# Patient Record
Sex: Male | Born: 1935 | Race: White | Hispanic: No | Marital: Single | Smoking: Former smoker
Health system: Southern US, Community
[De-identification: ages and names within clinical notes are randomized; demographics above are authoritative.]

## PROBLEM LIST (undated history)

## (undated) DIAGNOSIS — I10 Essential (primary) hypertension: Secondary | ICD-10-CM

## (undated) DIAGNOSIS — C801 Malignant (primary) neoplasm, unspecified: Secondary | ICD-10-CM

## (undated) DIAGNOSIS — I219 Acute myocardial infarction, unspecified: Secondary | ICD-10-CM

## (undated) DIAGNOSIS — I639 Cerebral infarction, unspecified: Secondary | ICD-10-CM

## (undated) DIAGNOSIS — K219 Gastro-esophageal reflux disease without esophagitis: Secondary | ICD-10-CM

## (undated) DIAGNOSIS — I2510 Atherosclerotic heart disease of native coronary artery without angina pectoris: Secondary | ICD-10-CM

## (undated) DIAGNOSIS — M1990 Unspecified osteoarthritis, unspecified site: Secondary | ICD-10-CM

## (undated) HISTORY — PX: HERNIA REPAIR: SHX51

## (undated) HISTORY — PX: CARDIAC CATHETERIZATION: SHX172

## (undated) HISTORY — PX: CORONARY ANGIOPLASTY: SHX604

---

## 2001-01-17 ENCOUNTER — Inpatient Hospital Stay (HOSPITAL_COMMUNITY): Admission: EM | Admit: 2001-01-17 | Discharge: 2001-01-21 | Payer: Self-pay

## 2002-07-19 ENCOUNTER — Encounter: Payer: Self-pay | Admitting: Specialist

## 2002-07-19 ENCOUNTER — Ambulatory Visit (HOSPITAL_COMMUNITY): Admission: RE | Admit: 2002-07-19 | Discharge: 2002-07-19 | Payer: Self-pay | Admitting: Specialist

## 2002-08-10 ENCOUNTER — Inpatient Hospital Stay (HOSPITAL_COMMUNITY): Admission: RE | Admit: 2002-08-10 | Discharge: 2002-08-11 | Payer: Self-pay | Admitting: Specialist

## 2002-08-10 ENCOUNTER — Encounter: Payer: Self-pay | Admitting: Specialist

## 2005-06-26 ENCOUNTER — Ambulatory Visit: Payer: Self-pay | Admitting: Ophthalmology

## 2005-07-01 ENCOUNTER — Ambulatory Visit: Payer: Self-pay | Admitting: Ophthalmology

## 2010-09-19 DIAGNOSIS — I10 Essential (primary) hypertension: Secondary | ICD-10-CM

## 2010-09-19 HISTORY — DX: Essential (primary) hypertension: I10

## 2011-05-15 ENCOUNTER — Inpatient Hospital Stay: Payer: Self-pay | Admitting: Internal Medicine

## 2011-05-16 DIAGNOSIS — G459 Transient cerebral ischemic attack, unspecified: Secondary | ICD-10-CM

## 2011-07-30 DIAGNOSIS — I2510 Atherosclerotic heart disease of native coronary artery without angina pectoris: Secondary | ICD-10-CM

## 2011-07-30 HISTORY — DX: Atherosclerotic heart disease of native coronary artery without angina pectoris: I25.10

## 2011-08-14 ENCOUNTER — Other Ambulatory Visit: Payer: Self-pay | Admitting: Otolaryngology

## 2011-08-27 ENCOUNTER — Encounter (HOSPITAL_COMMUNITY): Payer: Self-pay | Admitting: Pharmacy Technician

## 2011-09-03 NOTE — H&P (Signed)
Chief Complaint:  left knee pain.  Subjective: Patient is admitted for left total knee arthroplasty.  Patient is a 78 y.o. male who has been seen by Dr. Shelle Iron for ongoing hip pain.  They have been followed and found to have continued progressive pain.  They have been treated conservatively in the past including medications.  Despite conservative measures, they continue to have pain.  X-rays show that the patient has tri compartmental arthritis .  It is felt that they would benefit from undergoing surgical intervention.  Risks and benefits have been discussed with the patient and they elect to proceed with surgery.  The patient has no contraindications to the upcoming procedure such as ongoing infection or progressive neurological disease.  Allergies: Allergies  Allergen Reactions  . 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 male, well-nourished, well-developed, no acute distress. Alert, oriented, cooperative. Antalgic gait HENT:  Normocephalic, atraumatic. Pupils round and reactive. EOMs intact. NECK:  Supple, no bruits. CHEST:  Clear to anterior and posterior chest walls. No rhonchi, rales, wheezes. HEART:  Regular, rate and rhythm.  No murmurs.  S1 and S2 noted. ABDOMEN:  Soft, nontender, bowel sounds present. RECTAL/BREAST/GENITALIA:  Not done, not pertinent to present  illness. EXTREMITIES:  Mild effusion, TTP medial joint line, -5 to 110  Vitals: Pulse:69 Respirations:16 Blood Pressure:150/68  Assessment/Plan: End stage arthritis, left knee  The patient is being admitted to [facility] to undergo a left total knee arthroplasty.  Surgery will be performed by Dr. Jene Every.  Risks and benefits have been discussed with the patient and they elect to proceed wth the procedure.

## 2011-09-04 ENCOUNTER — Encounter (HOSPITAL_COMMUNITY)
Admission: RE | Admit: 2011-09-04 | Discharge: 2011-09-04 | Disposition: A | Discharge status: Home / Self Care | Payer: Medicare Other | Source: Ambulatory Visit | Attending: Specialist | Admitting: Specialist

## 2011-09-04 ENCOUNTER — Ambulatory Visit (HOSPITAL_COMMUNITY)
Admission: RE | Admit: 2011-09-04 | Discharge: 2011-09-04 | Disposition: A | Discharge status: Home / Self Care | Payer: Medicare Other | Source: Ambulatory Visit | Attending: Otolaryngology | Admitting: Otolaryngology

## 2011-09-04 ENCOUNTER — Encounter (HOSPITAL_COMMUNITY): Payer: Self-pay

## 2011-09-04 DIAGNOSIS — Z01812 Encounter for preprocedural laboratory examination: Secondary | ICD-10-CM | POA: Insufficient documentation

## 2011-09-04 DIAGNOSIS — Z01818 Encounter for other preprocedural examination: Secondary | ICD-10-CM | POA: Insufficient documentation

## 2011-09-04 DIAGNOSIS — I10 Essential (primary) hypertension: Secondary | ICD-10-CM | POA: Insufficient documentation

## 2011-09-04 HISTORY — DX: Essential (primary) hypertension: I10

## 2011-09-04 HISTORY — DX: Unspecified osteoarthritis, unspecified site: M19.90

## 2011-09-04 HISTORY — DX: Malignant (primary) neoplasm, unspecified: C80.1

## 2011-09-04 HISTORY — DX: Acute myocardial infarction, unspecified: I21.9

## 2011-09-04 HISTORY — DX: Cerebral infarction, unspecified: I63.9

## 2011-09-04 HISTORY — DX: Atherosclerotic heart disease of native coronary artery without angina pectoris: I25.10

## 2011-09-04 HISTORY — DX: Gastro-esophageal reflux disease without esophagitis: K21.9

## 2011-09-04 LAB — COMPREHENSIVE METABOLIC PANEL
ALT: 40 U/L (ref 0–53)
AST: 34 U/L (ref 0–37)
Albumin: 4.4 g/dL (ref 3.5–5.2)
Alkaline Phosphatase: 115 U/L (ref 39–117)
CO2: 27 mEq/L (ref 19–32)
Chloride: 99 mEq/L (ref 96–112)
GFR calc non Af Amer: 63 mL/min — ABNORMAL LOW (ref >90)
Potassium: 4.8 mEq/L (ref 3.5–5.1)
Sodium: 136 mEq/L (ref 135–145)
Total Bilirubin: 0.6 mg/dL (ref 0.3–1.2)

## 2011-09-04 LAB — URINALYSIS, ROUTINE W REFLEX MICROSCOPIC
Glucose, UA: NEGATIVE mg/dL
Hgb urine dipstick: NEGATIVE
Ketones, ur: NEGATIVE mg/dL
Leukocytes, UA: NEGATIVE
Protein, ur: NEGATIVE mg/dL
pH: 6.5 (ref 5.0–8.0)

## 2011-09-04 LAB — DIFFERENTIAL
Basophils Absolute: 0 10*3/uL (ref 0.0–0.1)
Basophils Relative: 0 % (ref 0–1)
Eosinophils Relative: 3 % (ref 0–5)
Monocytes Absolute: 0.7 10*3/uL (ref 0.1–1.0)
Monocytes Relative: 12 % (ref 3–12)

## 2011-09-04 LAB — APTT: aPTT: 32 seconds (ref 24–37)

## 2011-09-04 LAB — CBC
MCV: 87.4 fL (ref 78.0–100.0)
Platelets: 161 10*3/uL (ref 150–400)
RBC: 4.6 MIL/uL (ref 4.22–5.81)
WBC: 5.9 10*3/uL (ref 4.0–10.5)

## 2011-09-04 LAB — SURGICAL PCR SCREEN: MRSA, PCR: NEGATIVE

## 2011-09-04 NOTE — Patient Instructions (Signed)
22 [name] [name]  09/04/2011   Your procedure is scheduled on:  09/12/11 100pm-330 pm  Report to [facility] at 1100 AM.  Call this number if you have problems the morning of surgery: [phone]   Remember:   Do not eat food:After Midnight.  May have clear liquids:until 0700 am then npo .  Clear liquids include soda, tea, black coffee, apple or grape juice, broth.  Take these medicines the morning of surgery with A SIP OF WATER:    Do not wear jewelry,  Do not wear lotions, powders, or perfumes.   .  Do not bring valuables to the hospital.  Contacts, dentures or bridgework may not be worn into surgery.  Leave suitcase in the car. After surgery it may be brought to your room.  For patients admitted to the hospital, checkout time is 11:00 AM the day of discharge.     Special Instructions: CHG Shower Use Special Wash: 1/2 bottle night before surgery and 1/2 bottle morning of surgery. shower chin to toes with CHG.  Wash face and private parts with regular soap.     Please read over the following fact sheets that you were given: MRSA Information, Blood Transfusion Fact Sheet, Incentive Spirometry Fact Sheet, coughing and deep breathing exercises, leg exercises

## 2011-09-12 ENCOUNTER — Encounter (HOSPITAL_COMMUNITY): Payer: Self-pay | Admitting: Anesthesiology

## 2011-09-12 ENCOUNTER — Encounter (HOSPITAL_COMMUNITY): Payer: Self-pay | Admitting: *Deleted

## 2011-09-12 ENCOUNTER — Inpatient Hospital Stay (HOSPITAL_COMMUNITY): Payer: Medicare Other

## 2011-09-12 ENCOUNTER — Inpatient Hospital Stay (HOSPITAL_COMMUNITY): Payer: Medicare Other | Admitting: Anesthesiology

## 2011-09-12 ENCOUNTER — Encounter (HOSPITAL_COMMUNITY)
Admission: RE | Disposition: A | Discharge status: Home Health | Payer: Self-pay | Source: Ambulatory Visit | Attending: Specialist

## 2011-09-12 ENCOUNTER — Inpatient Hospital Stay (HOSPITAL_COMMUNITY)
Admission: RE | Admit: 2011-09-12 | Discharge: 2011-09-15 | DRG: 470 | Disposition: A | Discharge status: Home Health | Payer: Medicare Other | Source: Ambulatory Visit | Attending: Specialist | Admitting: Specialist

## 2011-09-12 DIAGNOSIS — I252 Old myocardial infarction: Secondary | ICD-10-CM

## 2011-09-12 DIAGNOSIS — Z8673 Personal history of transient ischemic attack (TIA), and cerebral infarction without residual deficits: Secondary | ICD-10-CM

## 2011-09-12 DIAGNOSIS — M1710 Unilateral primary osteoarthritis, unspecified knee: Principal | ICD-10-CM | POA: Diagnosis present

## 2011-09-12 DIAGNOSIS — R339 Retention of urine, unspecified: Secondary | ICD-10-CM | POA: Diagnosis not present

## 2011-09-12 DIAGNOSIS — I10 Essential (primary) hypertension: Secondary | ICD-10-CM | POA: Diagnosis present

## 2011-09-12 DIAGNOSIS — M1990 Unspecified osteoarthritis, unspecified site: Secondary | ICD-10-CM

## 2011-09-12 DIAGNOSIS — I2510 Atherosclerotic heart disease of native coronary artery without angina pectoris: Secondary | ICD-10-CM | POA: Diagnosis present

## 2011-09-12 HISTORY — PX: TOTAL KNEE ARTHROPLASTY: SHX125

## 2011-09-12 LAB — TYPE AND SCREEN
ABO/RH(D): A POS
Antibody Screen: NEGATIVE

## 2011-09-12 SURGERY — ARTHROPLASTY, KNEE, TOTAL
Anesthesia: General | Site: Knee | Laterality: Left | Wound class: Clean

## 2011-09-12 MED ORDER — RAMIPRIL 10 MG PO CAPS
20.0000 mg | ORAL_CAPSULE | Freq: Every day | ORAL | Status: DC
  Administered 2011-09-13 – 2011-09-15 (×3): 20 mg via ORAL
  Filled 2011-09-12 (×4): qty 2

## 2011-09-12 MED ORDER — PROPOFOL 10 MG/ML IV EMUL
INTRAVENOUS | Status: DC | PRN
  Administered 2011-09-12: 50 mg via INTRAVENOUS

## 2011-09-12 MED ORDER — ONDANSETRON HCL 4 MG PO TABS: 4.0000 mg | ORAL_TABLET | Freq: Four times a day (QID) | ORAL | Status: DC | PRN

## 2011-09-12 MED ORDER — ONDANSETRON HCL 4 MG/2ML IJ SOLN: 4.0000 mg | Freq: Four times a day (QID) | INTRAMUSCULAR | Status: DC | PRN

## 2011-09-12 MED ORDER — LIDOCAINE HCL (CARDIAC) 20 MG/ML IV SOLN
INTRAVENOUS | Status: DC | PRN
  Administered 2011-09-12: 80 mg via INTRAVENOUS

## 2011-09-12 MED ORDER — HYDROMORPHONE HCL PF 1 MG/ML IJ SOLN
INTRAMUSCULAR | Status: DC | PRN
  Administered 2011-09-12: 0.5 mg via INTRAVENOUS

## 2011-09-12 MED ORDER — ROCURONIUM BROMIDE 100 MG/10ML IV SOLN
INTRAVENOUS | Status: DC | PRN
  Administered 2011-09-12: 40 mg via INTRAVENOUS

## 2011-09-12 MED ORDER — MAGNESIUM 500 MG PO CAPS: 1.0000 | ORAL_CAPSULE | Freq: Every day | ORAL | Status: DC

## 2011-09-12 MED ORDER — AMLODIPINE BESYLATE 5 MG PO TABS
5.0000 mg | ORAL_TABLET | Freq: Every day | ORAL | Status: DC
  Administered 2011-09-13 – 2011-09-15 (×3): 5 mg via ORAL
  Filled 2011-09-12 (×4): qty 1

## 2011-09-12 MED ORDER — CHLORHEXIDINE GLUCONATE 4 % EX LIQD: 60.0000 mL | Freq: Once | CUTANEOUS | Status: DC

## 2011-09-12 MED ORDER — NEOSTIGMINE METHYLSULFATE 1 MG/ML IJ SOLN
INTRAMUSCULAR | Status: DC | PRN
  Administered 2011-09-12: 4 mg via INTRAVENOUS

## 2011-09-12 MED ORDER — DIPHENHYDRAMINE HCL 12.5 MG/5ML PO ELIX: 12.5000 mg | ORAL_SOLUTION | ORAL | Status: DC | PRN

## 2011-09-12 MED ORDER — RIVAROXABAN 10 MG PO TABS: 10.0000 mg | ORAL_TABLET | Freq: Every day | ORAL | Status: DC

## 2011-09-12 MED ORDER — METOCLOPRAMIDE HCL 10 MG PO TABS
5.0000 mg | ORAL_TABLET | Freq: Three times a day (TID) | ORAL | Status: DC | PRN
  Administered 2011-09-13: 10 mg via ORAL
  Filled 2011-09-12: qty 1

## 2011-09-12 MED ORDER — SODIUM CHLORIDE 0.9 % IJ SOLN: 9.0000 mL | INTRAMUSCULAR | Status: DC | PRN

## 2011-09-12 MED ORDER — ACETAMINOPHEN 325 MG PO TABS: 650.0000 mg | ORAL_TABLET | Freq: Four times a day (QID) | ORAL | Status: DC | PRN

## 2011-09-12 MED ORDER — FENTANYL CITRATE 0.05 MG/ML IJ SOLN
INTRAMUSCULAR | Status: DC | PRN
  Administered 2011-09-12 (×5): 50 ug via INTRAVENOUS

## 2011-09-12 MED ORDER — METOCLOPRAMIDE HCL 5 MG/ML IJ SOLN: 5.0000 mg | Freq: Three times a day (TID) | INTRAMUSCULAR | Status: DC | PRN

## 2011-09-12 MED ORDER — DIPHENHYDRAMINE HCL 50 MG/ML IJ SOLN: 12.5000 mg | Freq: Four times a day (QID) | INTRAMUSCULAR | Status: DC | PRN

## 2011-09-12 MED ORDER — SODIUM CHLORIDE 0.9 % IR SOLN
Status: DC | PRN
  Administered 2011-09-12: 13:00:00

## 2011-09-12 MED ORDER — MENTHOL 3 MG MT LOZG: 1.0000 | LOZENGE | OROMUCOSAL | Status: DC | PRN

## 2011-09-12 MED ORDER — PROMETHAZINE HCL 25 MG/ML IJ SOLN: 6.2500 mg | INTRAMUSCULAR | Status: DC | PRN

## 2011-09-12 MED ORDER — HYDROMORPHONE 0.3 MG/ML IV SOLN
INTRAVENOUS | Status: DC
  Administered 2011-09-12 (×2): 0.2 mg via INTRAVENOUS
  Administered 2011-09-12: 15:00:00 via INTRAVENOUS
  Administered 2011-09-13: 0.599 mg via INTRAVENOUS
  Administered 2011-09-13: 0 mg via INTRAVENOUS
  Administered 2011-09-13: 0.4 mg via INTRAVENOUS
  Administered 2011-09-13: 0.6 mg via INTRAVENOUS
  Administered 2011-09-13: 0.399 mg via INTRAVENOUS
  Administered 2011-09-13 – 2011-09-14 (×2): 0.599 mg via INTRAVENOUS
  Administered 2011-09-14: 0.199 mg via INTRAVENOUS
  Administered 2011-09-14: 0.2 mg via INTRAVENOUS
  Administered 2011-09-14: 0.6 mg via INTRAVENOUS
  Administered 2011-09-14: 0.4 mg via INTRAVENOUS
  Administered 2011-09-15: 0.2 mg via INTRAVENOUS
  Filled 2011-09-12: qty 25

## 2011-09-12 MED ORDER — ACETAMINOPHEN 650 MG RE SUPP: 650.0000 mg | Freq: Four times a day (QID) | RECTAL | Status: DC | PRN

## 2011-09-12 MED ORDER — OXYCODONE-ACETAMINOPHEN 5-325 MG PO TABS: 1.0000 | ORAL_TABLET | ORAL | Status: DC | PRN

## 2011-09-12 MED ORDER — KCL IN DEXTROSE-NACL 10-5-0.45 MEQ/L-%-% IV SOLN
INTRAVENOUS | Status: DC
  Administered 2011-09-12: 19:00:00 via INTRAVENOUS
  Filled 2011-09-12 (×4): qty 1000

## 2011-09-12 MED ORDER — METOPROLOL SUCCINATE ER 25 MG PO TB24
25.0000 mg | ORAL_TABLET | Freq: Every day | ORAL | Status: DC
  Administered 2011-09-13 – 2011-09-15 (×3): 25 mg via ORAL
  Filled 2011-09-12 (×4): qty 1

## 2011-09-12 MED ORDER — ENOXAPARIN SODIUM 30 MG/0.3ML ~~LOC~~ SOLN
30.0000 mg | SUBCUTANEOUS | Status: DC
  Administered 2011-09-13 – 2011-09-15 (×3): 30 mg via SUBCUTANEOUS
  Filled 2011-09-12 (×4): qty 0.3

## 2011-09-12 MED ORDER — GLYCOPYRROLATE 0.2 MG/ML IJ SOLN
INTRAMUSCULAR | Status: DC | PRN
  Administered 2011-09-12: .6 mg via INTRAVENOUS

## 2011-09-12 MED ORDER — ISOSORBIDE MONONITRATE ER 30 MG PO TB24
30.0000 mg | ORAL_TABLET | Freq: Every day | ORAL | Status: DC
  Administered 2011-09-13 – 2011-09-15 (×3): 30 mg via ORAL
  Filled 2011-09-12 (×4): qty 1

## 2011-09-12 MED ORDER — DEXAMETHASONE SODIUM PHOSPHATE 10 MG/ML IJ SOLN
INTRAMUSCULAR | Status: DC | PRN
  Administered 2011-09-12: 10 mg via INTRAVENOUS

## 2011-09-12 MED ORDER — SODIUM CHLORIDE 0.9 % IR SOLN
Status: DC | PRN
  Administered 2011-09-12: 3000 mL

## 2011-09-12 MED ORDER — ETOMIDATE 2 MG/ML IV SOLN
INTRAVENOUS | Status: DC | PRN
  Administered 2011-09-12: 6 mg via INTRAVENOUS

## 2011-09-12 MED ORDER — ACETAMINOPHEN 10 MG/ML IV SOLN
INTRAVENOUS | Status: DC | PRN
  Administered 2011-09-12: 1000 mg via INTRAVENOUS

## 2011-09-12 MED ORDER — LACTATED RINGERS IV SOLN
INTRAVENOUS | Status: DC
  Administered 2011-09-12: 12:00:00 via INTRAVENOUS

## 2011-09-12 MED ORDER — MAGNESIUM OXIDE 400 MG PO TABS
400.0000 mg | ORAL_TABLET | Freq: Every day | ORAL | Status: DC
  Administered 2011-09-12 – 2011-09-15 (×4): 400 mg via ORAL
  Filled 2011-09-12 (×5): qty 1

## 2011-09-12 MED ORDER — CEFAZOLIN SODIUM-DEXTROSE 2-3 GM-% IV SOLR
2.0000 g | INTRAVENOUS | Status: AC
  Administered 2011-09-12: 2 g via INTRAVENOUS

## 2011-09-12 MED ORDER — CEFAZOLIN SODIUM 1-5 GM-% IV SOLN
1.0000 g | Freq: Four times a day (QID) | INTRAVENOUS | Status: AC
  Administered 2011-09-12 – 2011-09-13 (×3): 1 g via INTRAVENOUS
  Filled 2011-09-12 (×4): qty 50

## 2011-09-12 MED ORDER — PHENOL 1.4 % MT LIQD: 1.0000 | OROMUCOSAL | Status: DC | PRN

## 2011-09-12 MED ORDER — ONDANSETRON HCL 4 MG/2ML IJ SOLN
INTRAMUSCULAR | Status: DC | PRN
  Administered 2011-09-12: 4 mg via INTRAVENOUS

## 2011-09-12 MED ORDER — NALOXONE HCL 0.4 MG/ML IJ SOLN: 0.4000 mg | INTRAMUSCULAR | Status: DC | PRN

## 2011-09-12 MED ORDER — DIPHENHYDRAMINE HCL 12.5 MG/5ML PO ELIX
12.5000 mg | ORAL_SOLUTION | Freq: Four times a day (QID) | ORAL | Status: DC | PRN
  Filled 2011-09-12: qty 5

## 2011-09-12 MED ORDER — METHOCARBAMOL 500 MG PO TABS
500.0000 mg | ORAL_TABLET | Freq: Four times a day (QID) | ORAL | Status: DC | PRN
  Administered 2011-09-14 – 2011-09-15 (×2): 500 mg via ORAL
  Filled 2011-09-12 (×2): qty 1

## 2011-09-12 MED ORDER — HYDROMORPHONE HCL PF 1 MG/ML IJ SOLN
0.2500 mg | INTRAMUSCULAR | Status: DC | PRN
  Administered 2011-09-12 (×2): 0.5 mg via INTRAVENOUS

## 2011-09-12 MED ORDER — METHOCARBAMOL 100 MG/ML IJ SOLN
500.0000 mg | Freq: Four times a day (QID) | INTRAMUSCULAR | Status: DC | PRN
  Administered 2011-09-12: 500 mg via INTRAVENOUS
  Filled 2011-09-12 (×2): qty 5

## 2011-09-12 SURGICAL SUPPLY — 59 items
BAG SPEC THK2 15X12 ZIP CLS (MISCELLANEOUS) ×1
BAG ZIPLOCK 12X15 (MISCELLANEOUS) ×2 IMPLANT
BANDAGE ELASTIC 4 VELCRO ST LF (GAUZE/BANDAGES/DRESSINGS) ×2 IMPLANT
BANDAGE ELASTIC 6 VELCRO ST LF (GAUZE/BANDAGES/DRESSINGS) ×2 IMPLANT
BANDAGE ESMARK 6X9 LF (GAUZE/BANDAGES/DRESSINGS) ×1 IMPLANT
BLADE SAG 18X100X1.27 (BLADE) ×2 IMPLANT
BLADE SAW SGTL 13.0X1.19X90.0M (BLADE) ×2 IMPLANT
BNDG CMPR 9X6 STRL LF SNTH (GAUZE/BANDAGES/DRESSINGS) ×1
BNDG ESMARK 6X9 LF (GAUZE/BANDAGES/DRESSINGS) ×2
CEMENT HV SMART SET (Cement) ×3 IMPLANT
CHLORAPREP W/TINT 26ML (MISCELLANEOUS) IMPLANT
CLOTH BEACON ORANGE TIMEOUT ST (SAFETY) ×2 IMPLANT
CUFF TOURN SGL QUICK 34 (TOURNIQUET CUFF) ×2
CUFF TRNQT CYL 34X4X40X1 (TOURNIQUET CUFF) ×1 IMPLANT
DECANTER SPIKE VIAL GLASS SM (MISCELLANEOUS) ×2 IMPLANT
DRAPE LG THREE QUARTER DISP (DRAPES) ×3 IMPLANT
DRAPE ORTHO SPLIT 77X108 STRL (DRAPES) ×4
DRAPE POUCH INSTRU U-SHP 10X18 (DRAPES) ×2 IMPLANT
DRAPE SURG ORHT 6 SPLT 77X108 (DRAPES) ×2 IMPLANT
DRAPE U-SHAPE 47X51 STRL (DRAPES) ×2 IMPLANT
DRSG ADAPTIC 3X8 NADH LF (GAUZE/BANDAGES/DRESSINGS) ×2 IMPLANT
DRSG PAD ABDOMINAL 8X10 ST (GAUZE/BANDAGES/DRESSINGS) ×2 IMPLANT
DURAPREP 26ML APPLICATOR (WOUND CARE) ×2 IMPLANT
ELECT REM PT RETURN 9FT ADLT (ELECTROSURGICAL) ×2
ELECTRODE REM PT RTRN 9FT ADLT (ELECTROSURGICAL) ×1 IMPLANT
EVACUATOR 1/8 PVC DRAIN (DRAIN) ×2 IMPLANT
FACESHIELD LNG OPTICON STERILE (SAFETY) ×10 IMPLANT
GAUZE SPONGE 4X4 12PLY STRL LF (GAUZE/BANDAGES/DRESSINGS) ×1 IMPLANT
GLOVE BIOGEL PI IND STRL 6.5 (GLOVE) ×1 IMPLANT
GLOVE BIOGEL PI IND STRL 8 (GLOVE) ×1 IMPLANT
GLOVE BIOGEL PI INDICATOR 6.5 (GLOVE) ×1
GLOVE BIOGEL PI INDICATOR 8 (GLOVE) ×1
GLOVE ECLIPSE 6.5 STRL STRAW (GLOVE) ×2 IMPLANT
GLOVE SURG SS PI 8.0 STRL IVOR (GLOVE) ×4 IMPLANT
GOWN PREVENTION PLUS XLARGE (GOWN DISPOSABLE) ×2 IMPLANT
HANDPIECE INTERPULSE COAX TIP (DISPOSABLE) ×2
IMMOBILIZER KNEE 20 (SOFTGOODS) ×2
IMMOBILIZER KNEE 20 THIGH 36 (SOFTGOODS) ×1 IMPLANT
KIT BASIN OR (CUSTOM PROCEDURE TRAY) ×2 IMPLANT
MANIFOLD NEPTUNE II (INSTRUMENTS) ×2 IMPLANT
NEEDLE HYPO 22GX1.5 SAFETY (NEEDLE) ×1 IMPLANT
NS IRRIG 1000ML POUR BTL (IV SOLUTION) ×1 IMPLANT
PACK TOTAL JOINT (CUSTOM PROCEDURE TRAY) ×2 IMPLANT
PADDING CAST COTTON 6X4 STRL (CAST SUPPLIES) ×2 IMPLANT
POSITIONER SURGICAL ARM (MISCELLANEOUS) ×2 IMPLANT
SET HNDPC FAN SPRY TIP SCT (DISPOSABLE) ×1 IMPLANT
SPONGE SURGIFOAM ABS GEL 100 (HEMOSTASIS) ×2 IMPLANT
STAPLER VISISTAT (STAPLE) ×2 IMPLANT
SUCTION FRAZIER 12FR DISP (SUCTIONS) ×2 IMPLANT
SUT BONE WAX W31G (SUTURE) ×2 IMPLANT
SUT VIC AB 1 CT1 27 (SUTURE) ×8
SUT VIC AB 1 CT1 27XBRD ANTBC (SUTURE) ×4 IMPLANT
SUT VIC AB 2-0 CT1 27 (SUTURE) ×6
SUT VIC AB 2-0 CT1 TAPERPNT 27 (SUTURE) ×3 IMPLANT
SYR 30ML LL (SYRINGE) ×2 IMPLANT
TOWER CARTRIDGE SMART MIX (DISPOSABLE) ×2 IMPLANT
TRAY FOLEY CATH 14FRSI W/METER (CATHETERS) ×2 IMPLANT
WATER STERILE IRR 1500ML POUR (IV SOLUTION) ×2 IMPLANT
WRAP KNEE MAXI GEL POST OP (GAUZE/BANDAGES/DRESSINGS) ×2 IMPLANT

## 2011-09-12 NOTE — Interval H&P Note (Signed)
History and Physical Interval Note:  09/12/2011 12:22 PM  [name] [name]  has presented today for surgery, with the diagnosis of Degenerative joint disease left knee    The various methods of treatment have been discussed with the patient and family. After consideration of risks, benefits and other options for treatment, the patient has consented to  Procedure(s): TOTAL KNEE ARTHROPLASTY as a surgical intervention .  The patients' history has been reviewed, patient examined, no change in status, stable for surgery.  I have reviewed the patients' chart and labs.  Questions were answered to the patient's satisfaction.     Kassey Laforest C

## 2011-09-12 NOTE — Preoperative (Signed)
Beta Blockers   Reason not to administer Beta Blockers:Metoprolol taken this am 09-12-11 at 0630.

## 2011-09-12 NOTE — Anesthesia Postprocedure Evaluation (Signed)
  Anesthesia Post-op Note  Patient: [name] [name]  Procedure(s) Performed:  TOTAL KNEE ARTHROPLASTY  Patient Location: PACU  Anesthesia Type: General  Level of Consciousness: awake and alert   Airway and Oxygen Therapy: Patient Spontanous Breathing  Post-op Pain: mild  Post-op Assessment: Post-op Vital signs reviewed, Patient's Cardiovascular Status Stable, Respiratory Function Stable, Patent Airway and No signs of Nausea or vomiting  Post-op Vital Signs: stable  Complications: No apparent anesthesia complications

## 2011-09-12 NOTE — Progress Notes (Signed)
ANTICOAGULATION CONSULT NOTE - Initial Consult  Pharmacy Consult for Lovenox Indication: VTE prophylaxis  Allergies  Allergen Reactions  . Celebrex (Celecoxib)   . Vicodin (Hydrocodone-Acetaminophen) Other (See Comments)    NIGHTMARES     Patient Measurements: TBW= 82.6 KG (09/04/11) Ht: 175 cm   Vital Signs: Temp: 98 F (36.7 C) (01/18 1502) BP: 147/78 mmHg (01/18 1502) Pulse Rate: 55  (01/18 1502)  Labs: (09/04/11) Scr=1.03 H/H=14/40.2 PLT=161 PT/INR=13.03/26/03  Medical History: Past Medical History  Diagnosis Date  . Hypertension   . Coronary artery disease   . Myocardial infarction     2002, stent implanted   . Stroke     TIA 05/2011   . GERD (gastroesophageal reflux disease)   . Arthritis     arthritis in knees   . Cancer     hx of basal cell carcinoma     Medications:  Prescriptions prior to admission  Medication Sig Dispense Refill  . amLODipine (NORVASC) 5 MG tablet Take 5 mg by mouth daily before breakfast.       . clopidogrel (PLAVIX) 75 MG tablet Take 75 mg by mouth daily.      . fish oil-omega-3 fatty acids 1000 MG capsule Take 2 g by mouth daily.       . isosorbide mononitrate (IMDUR) 30 MG 24 hr tablet Take 30 mg by mouth daily before breakfast.       . Magnesium 500 MG CAPS Take 1 capsule by mouth daily.       . metoprolol succinate (TOPROL-XL) 25 MG 24 hr tablet Take 25 mg by mouth daily before breakfast.       . Multiple Vitamin (MULITIVITAMIN WITH MINERALS) TABS Take 1 tablet by mouth daily.       . ramipril (ALTACE) 10 MG capsule Take 20 mg by mouth daily before breakfast.       . rosuvastatin (CRESTOR) 40 MG tablet Take 40 mg by mouth at bedtime.       Marland Kitchen aspirin EC 81 MG tablet Take 81 mg by mouth daily before breakfast.       . naproxen sodium (ANAPROX) 220 MG tablet Take 440 mg by mouth 2 (two) times daily as needed. PAIN         Assessment: 78 yo male s/p Left TKA and Lovenox to be started for DVT prophylaxis. Will give Lovenox 30mg   Q24h due to concomittant ASA and Plavix.   Plan:  1. Lovenox 30mg  SQ Q24h to be started tomorrow at 10am 2. Will f/u Scr and CBC.  Dorethea Clan 09/12/2011,3:16 PM

## 2011-09-12 NOTE — Transfer of Care (Signed)
Immediate Anesthesia Transfer of Care Note  Patient: [name] [name]  Procedure(s) Performed:  TOTAL KNEE ARTHROPLASTY  Patient Location: PACU  Anesthesia Type: General  Level of Consciousness: awake, alert  and oriented  Airway & Oxygen Therapy: Patient Spontanous Breathing and Patient connected to face mask oxygen  Post-op Assessment: Report given to PACU RN, Post -op Vital signs reviewed and stable and Patient moving all extremities  Post vital signs: Reviewed and stable Filed Vitals:   09/12/11 1031  BP: 142/75  Pulse: 59  Temp: 36.7 C  Resp: 20    Complications: No apparent anesthesia complications

## 2011-09-12 NOTE — Brief Op Note (Signed)
09/12/2011  2:41 PM  PATIENT:  [name] [name]  78 y.o. male  PRE-OPERATIVE DIAGNOSIS:  Degenerative joint disease left knee    POST-OPERATIVE DIAGNOSIS:  Degenerative joint disease left knee    PROCEDURE:  Procedure(s): TOTAL KNEE ARTHROPLASTY  SURGEON:  Surgeon(s): Javier Docker, MD  PHYSICIAN ASSISTANT:   ASSISTANTS: Strader   ANESTHESIA:   spinal  EBL:  Total I/O In: 1000 [I.V.:1000] Out: 325 [Urine:275; Blood:50]  BLOOD ADMINISTERED:none  DRAINS: hemovac  LOCAL MEDICATIONS USED:  MARCAINE 20CC  SPECIMEN:  No Specimen  DISPOSITION OF SPECIMEN:  N/A  COUNTS:  YES  TOURNIQUET:   Total Tourniquet Time Documented: Thigh (Left) - 86 minutes  DICTATION: .Other Dictation: Dictation Number [id number]  PLAN OF CARE: Admit to inpatient   PATIENT DISPOSITION:  PACU - hemodynamically stable.   Delay start of Pharmacological VTE agent (>24hrs) due to surgical blood loss or risk of bleeding:  {YES/NO/NOT APPLICABLE:20182

## 2011-09-12 NOTE — Anesthesia Preprocedure Evaluation (Addendum)
Anesthesia Evaluation  Patient identified by MRN, date of birth, ID band Patient awake    Reviewed: Allergy & Precautions, H&P , NPO status , Patient's Chart, lab work & pertinent test results  Airway Mallampati: II TM Distance: >3 FB Neck ROM: Full    Dental No notable dental hx.    Pulmonary neg pulmonary ROS,  clear to auscultation  Pulmonary exam normal       Cardiovascular hypertension, + CAD and + Past MI Regular Normal    Neuro/Psych TIANegative Psych ROS   GI/Hepatic Neg liver ROS, GERD-  Medicated,  Endo/Other  Negative Endocrine ROS  Renal/GU negative Renal ROS  Genitourinary negative   Musculoskeletal negative musculoskeletal ROS (+)   Abdominal   Peds negative pediatric ROS (+)  Hematology negative hematology ROS (+)   Anesthesia Other Findings   Reproductive/Obstetrics negative OB ROS                          Anesthesia Physical Anesthesia Plan  ASA: III  Anesthesia Plan: General   Post-op Pain Management:    Induction: Intravenous  Airway Management Planned: Oral ETT and LMA  Additional Equipment:   Intra-op Plan:   Post-operative Plan: Extubation in OR  Informed Consent: I have reviewed the patients History and Physical, chart, labs and discussed the procedure including the risks, benefits and alternatives for the proposed anesthesia with the patient or authorized representative who has indicated his/her understanding and acceptance.   Dental advisory given  Plan Discussed with: CRNA  Anesthesia Plan Comments:         Anesthesia Quick Evaluation

## 2011-09-12 NOTE — Plan of Care (Signed)
Problem: Consults Goal: Diagnosis- Total Joint Replacement Outcome: Progressing Primary Total Knee     

## 2011-09-13 LAB — BASIC METABOLIC PANEL
CO2: 21 mEq/L (ref 19–32)
Calcium: 8.6 mg/dL (ref 8.4–10.5)
Creatinine, Ser: 0.74 mg/dL (ref 0.50–1.35)
GFR calc non Af Amer: 80 mL/min — ABNORMAL LOW (ref >90)
Glucose, Bld: 200 mg/dL — ABNORMAL HIGH (ref 70–99)
Sodium: 130 mEq/L — ABNORMAL LOW (ref 135–145)

## 2011-09-13 LAB — CBC
MCH: 30.7 pg (ref 26.0–34.0)
MCHC: 35.4 g/dL (ref 30.0–36.0)
MCV: 86.8 fL (ref 78.0–100.0)
Platelets: 139 10*3/uL — ABNORMAL LOW (ref 150–400)

## 2011-09-13 MED ORDER — ASPIRIN EC 81 MG PO TBEC
81.0000 mg | DELAYED_RELEASE_TABLET | Freq: Every day | ORAL | Status: DC
  Administered 2011-09-14 – 2011-09-15 (×2): 81 mg via ORAL
  Filled 2011-09-13 (×3): qty 1

## 2011-09-13 MED ORDER — CALCIUM CARBONATE ANTACID 500 MG PO CHEW
1.0000 | CHEWABLE_TABLET | Freq: Three times a day (TID) | ORAL | Status: DC | PRN
  Administered 2011-09-13: 200 mg via ORAL
  Filled 2011-09-13 (×3): qty 1

## 2011-09-13 MED ORDER — OXYCODONE HCL 5 MG PO TABS
10.0000 mg | ORAL_TABLET | ORAL | Status: DC | PRN
  Administered 2011-09-13 – 2011-09-15 (×2): 10 mg via ORAL
  Filled 2011-09-13 (×2): qty 2

## 2011-09-13 MED ORDER — CLOPIDOGREL BISULFATE 75 MG PO TABS
75.0000 mg | ORAL_TABLET | Freq: Every day | ORAL | Status: DC
  Administered 2011-09-13 – 2011-09-15 (×3): 75 mg via ORAL
  Filled 2011-09-13 (×4): qty 1

## 2011-09-13 NOTE — Progress Notes (Signed)
CM spoke with pt concerning d/c planning. Pt lives in [place]. Pt given choice list for [facility], per MD order for HHPT. Pt states no DME needed. Adult daughter to provide RW, crutches. Pt states having 5 adult children who will assist with home care.  Leonie Green [phone]

## 2011-09-13 NOTE — Progress Notes (Signed)
Subjective: 1 Day Post-Op Procedure(s) (LRB): TOTAL KNEE ARTHROPLASTY (Left) Patient reports pain as 6 on 0-10 scale.   Denies CP or SOB.  Positive flatus. Objective: Vital signs in last 24 hours: Temp:  [97.7 F (36.5 C)-98.3 F (36.8 C)] 98.3 F (36.8 C) (01/19 0451) Pulse Rate:  [52-62] 57  (01/19 0451) Resp:  [9-20] 20  (01/19 0800) BP: (110-148)/(53-78) 148/71 mmHg (01/19 0451) SpO2:  [90 %-100 %] 97 % (01/19 0451) Weight:  [82.555 kg (182 lb)] 82.555 kg (182 lb) (01/18 1800)  Intake/Output from previous day: 01/18 0701 - 01/19 0700 In: 3720 [P.O.:360; I.V.:3050; IV Piggyback:100] Out: 2715 [Urine:2625; Drains:40; Blood:50] Intake/Output this shift:     Basename 09/13/11 0450  HGB 12.8*    Basename 09/13/11 0450  WBC 12.3*  RBC 4.17*  HCT 36.2*  PLT 139*    Basename 09/13/11 0450  NA 130*  K 4.6  CL 97  CO2 21  BUN 13  CREATININE 0.74  GLUCOSE 200*  CALCIUM 8.6   No results found for this basename: LABPT:2,INR:2 in the last 72 hours  Neurologically intact Neurovascular intact Sensation intact distally Dorsiflexion/Plantar flexion intact Incision: dressing C/D/I Compartment soft  Assessment/Plan: 1 Day Post-Op Procedure(s) (LRB): TOTAL KNEE ARTHROPLASTY (Left) Advance diet Up with therapy Continue foley due to urinary output monitoring Will increase percocet and wean from PCA today Resume Plavix today along with Lovenox per pharm Dressing change in am KVO IV  Kamile Fassler R. 09/13/2011, 9:04 AM

## 2011-09-13 NOTE — Progress Notes (Signed)
Physical Therapy Treatment Patient Details Name: [name] [name] MRN: [id number] DOB: [birth year] Today's Date: 09/13/2011 5284-1324 1TA,1GT PT Assessment/Plan  PT - Assessment/Plan Comments on Treatment Session: Patient tolerating ambulation...reports decreased SOB as compared to am session.  Unable to wean from PCA...reports oral meds not helping pain.  Daughter arrived at end of session. PT Plan: Discharge plan remains appropriate PT Frequency: 7X/week Follow Up Recommendations: Home health PT;Supervision/Assistance - 24 hour Equipment Recommended: Rolling walker with 5" wheels PT Goals  Acute Rehab PT Goals PT Goal Formulation: With patient Time For Goal Achievement: 5 days Pt will go Supine/Side to Sit: with modified independence PT Goal: Supine/Side to Sit - Progress: Progressing toward goal Pt will go Sit to Supine/Side: with modified independence PT Goal: Sit to Supine/Side - Progress: Progressing toward goal Pt will go Sit to Stand: with supervision PT Goal: Sit to Stand - Progress: Progressing toward goal Pt will go Stand to Sit: with supervision PT Goal: Stand to Sit - Progress: Progressing toward goal Pt will Ambulate: >150 feet;with supervision;with rolling walker PT Goal: Ambulate - Progress: Progressing toward goal Pt will Perform Home Exercise Program: with supervision, verbal cues required/provided PT Goal: Perform Home Exercise Program - Progress: Progressing toward goal  PT Treatment Precautions/Restrictions  Precautions Precautions: Knee Required Braces or Orthoses: Yes Knee Immobilizer: On when out of bed or walking;Discontinue once straight leg raise with < 10 degree lag Restrictions Weight Bearing Restrictions: Yes LLE Weight Bearing: Weight bearing as tolerated Mobility (including Balance) Bed Mobility Bed Mobility: Yes Supine to Sit: 4: Min assist Supine to Sit Details (indicate cue type and reason): for left LE Transfers Transfers: Yes Sit to  Stand: 4: Min assist;With upper extremity assist;From bed Sit to Stand Details (indicate cue type and reason): cues for safety (likes to pull up on walker) Stand to Sit: 4: Min assist;With upper extremity assist;To bed Stand to Sit Details: cues for left leg out Ambulation/Gait Ambulation/Gait: Yes Ambulation/Gait Assistance: 4: Min assist Ambulation/Gait Assistance Details (indicate cue type and reason): minguard assist cues initially to remember sequence (reports left leg in no more pain than right) Ambulation Distance (Feet): 200 Feet Assistive device: Rolling walker Gait Pattern: Step-to pattern    Exercise  Total Joint Exercises Knee Flexion: AROM;Left;5 reps;Seated End of Session PT - End of Session Equipment Utilized During Treatment: Gait belt;Left knee immobilizer Activity Tolerance: Patient tolerated treatment well Patient left: in bed;with call bell in reach;with family/visitor present Nurse Communication: Mobility status for transfers;Mobility status for ambulation General Behavior During Session: [facility] for tasks performed Cognition: [facility] for tasks performed  [employer] 09/13/2011, 3:59 PM

## 2011-09-13 NOTE — Progress Notes (Signed)
Orthopedic Tech Progress Note Patient Details:  [name] [name] Sep 14, 1920 [id number]       CPM Left Knee CPM Left Knee: Off Left Knee Flexion (Degrees): 60  Left Knee Extension (Degrees): 10              Patient ID: [name] [name], male   DOB: [birth year], 78 y.o.   MRN: [id number]   [name] [name] 09/13/2011, 6:43 PM

## 2011-09-13 NOTE — Progress Notes (Signed)
Physical Therapy Evaluation Patient Details Name: [name] [name] MRN: [id number] DOB: 1921/06/18 Today's Date: 09/13/2011 8413-2440 Ev2  Problem List: There is no problem list on file for this patient.   Past Medical History:  Past Medical History  Diagnosis Date  . Hypertension   . Coronary artery disease   . Stroke     TIA 05/2011   . GERD (gastroesophageal reflux disease)   . Arthritis     arthritis in knees   . Cancer     hx of basal cell carcinoma   . Myocardial infarction     2002, stent implanted    Past Surgical History:  Past Surgical History  Procedure Date  . Cardiac catheterization   . Coronary angioplasty     stent 2002   . Hernia repair     bilateral     PT Assessment/Plan/Recommendation PT Assessment Clinical Impression Statement: Patient presents s/p left TKA with decreased independence in mobility and will benefit from skilled PT to maximize independence and allow d/c home with family assist and HHPT. PT Recommendation/Assessment: Patient will need skilled PT in the acute care venue PT Problem List: Decreased strength;Decreased range of motion;Decreased activity tolerance;Decreased mobility;Decreased balance;Pain;Decreased knowledge of use of DME;Decreased knowledge of precautions PT Therapy Diagnosis : Difficulty walking;Acute pain PT Plan PT Frequency: 7X/week PT Treatment/Interventions: DME instruction;Gait training;Stair training;Functional mobility training;Therapeutic activities;Therapeutic exercise;Patient/family education PT Recommendation Follow Up Recommendations: Home health PT Equipment Recommended: Rolling walker with 5" wheels PT Goals  Acute Rehab PT Goals PT Goal Formulation: With patient Time For Goal Achievement: 5 days Pt will go Supine/Side to Sit: with modified independence PT Goal: Supine/Side to Sit - Progress: Goal set today Pt will go Sit to Supine/Side: with modified independence PT Goal: Sit to Supine/Side - Progress:  Goal set today Pt will go Sit to Stand: with supervision PT Goal: Sit to Stand - Progress: Goal set today Pt will go Stand to Sit: with supervision PT Goal: Stand to Sit - Progress: Goal set today Pt will Ambulate: >150 feet;with supervision;with rolling walker PT Goal: Ambulate - Progress: Goal set today Pt will Go Up / Down Stairs: 3-5 stairs;with rail(s);with min assist PT Goal: Up/Down Stairs - Progress: Goal set today Pt will Perform Home Exercise Program: with supervision, verbal cues required/provided PT Goal: Perform Home Exercise Program - Progress: Goal set today  PT Evaluation Precautions/Restrictions  Precautions Precautions: Knee Restrictions Weight Bearing Restrictions: Yes LLE Weight Bearing: Weight bearing as tolerated Prior Functioning  Home Living Lives With: Alone Receives Help From: Family (daughters wil lbe assisting at discharge) Type of Home: House Home Layout: One level Home Access: Stairs to enter Entrance Stairs-Rails: Left;Right;Can reach both Entrance Stairs-Number of Steps: 4 Prior Function Level of Independence: Independent with basic ADLs;Independent with transfers;Independent with gait;Independent with homemaking with ambulation Driving: Yes Vocation: Full time employment Cognition Cognition Arousal/Alertness: Awake/alert Overall Cognitive Status: Appears within functional limits for tasks assessed Orientation Level: Oriented X4 Sensation/Coordination   Extremity Assessment RLE Assessment RLE Assessment: Within Functional Limits LLE Assessment LLE Assessment: Exceptions to WFL LLE AROM (degrees) LLE Overall AROM Comments: AAROM grossly left knee extension -20*, flexion 45* LLE Strength LLE Overall Strength Comments: able to lift antigravity independently with significant quad lag Mobility (including Balance) Bed Mobility Bed Mobility: Yes Supine to Sit: 4: Min assist Supine to Sit Details (indicate cue type and reason): for left  LE Sitting - Scoot to Edge of Bed: 5: Supervision Transfers Transfers: Yes Sit to Stand: 4: Min assist;With  upper extremity assist;From bed Sit to Stand Details (indicate cue type and reason): cues for technique and minguard assist Stand to Sit: 5: Supervision;To chair/3-in-1;With upper extremity assist;With armrests Stand to Sit Details: cues for hand placement and left leg out to sit Ambulation/Gait Ambulation/Gait: Yes Ambulation/Gait Assistance: 4: Min assist Ambulation/Gait Assistance Details (indicate cue type and reason): minguard with cues for sequence, posture, technique Ambulation Distance (Feet): 100 Feet Assistive device: Rolling walker Gait Pattern: Trunk flexed;Antalgic    Exercise  Total Joint Exercises Ankle Circles/Pumps: AROM;Both;20 reps;Supine Quad Sets: AROM;Left;10 reps;Supine Heel Slides: AAROM;Left;10 reps;Supine Hip ABduction/ADduction: AAROM;Left;10 reps;Supine End of Session PT - End of Session Equipment Utilized During Treatment: Gait belt;Left knee immobilizer Activity Tolerance: Patient tolerated treatment well Patient left: in chair;with call bell in reach Nurse Communication: Mobility status for transfers;Mobility status for ambulation General Behavior During Session: [facility] for tasks performed Cognition: [place] for tasks performed  [facility] 09/13/2011, 12:08 PM

## 2011-09-14 LAB — BASIC METABOLIC PANEL
BUN: 15 mg/dL (ref 6–23)
CO2: 26 mEq/L (ref 19–32)
GFR calc non Af Amer: 73 mL/min — ABNORMAL LOW (ref >90)
Glucose, Bld: 157 mg/dL — ABNORMAL HIGH (ref 70–99)
Potassium: 4.3 mEq/L (ref 3.5–5.1)
Sodium: 133 mEq/L — ABNORMAL LOW (ref 135–145)

## 2011-09-14 LAB — CBC
HCT: 32.4 % — ABNORMAL LOW (ref 39.0–52.0)
Hemoglobin: 11.5 g/dL — ABNORMAL LOW (ref 13.0–17.0)
MCH: 30.9 pg (ref 26.0–34.0)
MCHC: 35.5 g/dL (ref 30.0–36.0)
RBC: 3.72 MIL/uL — ABNORMAL LOW (ref 4.22–5.81)

## 2011-09-14 NOTE — Progress Notes (Signed)
Patient ID: [name] [name], male   DOB: 06-29-1921, 78 y.o.   MRN: [id number] Post op day 2. Afebrile.  Hgb 11.8.  Wound redressed--incision clean and dry.

## 2011-09-14 NOTE — Progress Notes (Signed)
Physical Therapy Treatment Patient Details Name: [name] [name] MRN: [id number] DOB: 06/30/1921 Today's Date: 09/14/2011 1415-1450 1GT,1TE  PT Assessment/Plan  PT - Assessment/Plan Comments on Treatment Session: Patient still on PCA for pain control.  Has learned not to overdo since he was so sore last night from yesterdays sessions.  Will need HHPT and 24* assist at d/c. PT Plan: Discharge plan remains appropriate PT Frequency: 7X/week Follow Up Recommendations: Home health PT;Supervision/Assistance - 24 hour Equipment Recommended: Rolling walker with 5" wheels PT Goals  Acute Rehab PT Goals Pt will go Supine/Side to Sit: with modified independence PT Goal: Supine/Side to Sit - Progress: Progressing toward goal Pt will go Sit to Supine/Side: with modified independence PT Goal: Sit to Supine/Side - Progress: Progressing toward goal Pt will go Sit to Stand: with supervision PT Goal: Sit to Stand - Progress: Progressing toward goal Pt will go Stand to Sit: with supervision PT Goal: Stand to Sit - Progress: Progressing toward goal Pt will Ambulate: with supervision;>150 feet;with rolling walker PT Goal: Ambulate - Progress: Not progressing (due to cautious after increased pain yesterday) Pt will Perform Home Exercise Program: with supervision, verbal cues required/provided PT Goal: Perform Home Exercise Program - Progress: Progressing toward goal  PT Treatment Precautions/Restrictions  Precautions Precautions: Knee Required Braces or Orthoses: Yes Knee Immobilizer: On except when in CPM;On when out of bed or walking;Discontinue once straight leg raise with < 10 degree lag Restrictions Weight Bearing Restrictions: Yes LLE Weight Bearing: Weight bearing as tolerated Mobility (including Balance) Bed Mobility Bed Mobility: Yes Supine to Sit: 4: Min assist;HOB flat Supine to Sit Details (indicate cue type and reason): for left LE Sitting - Scoot to Edge of Bed: 6: Modified  independent (Device/Increase time) Sitting - Scoot to Edge of Bed Details (indicate cue type and reason): min assist to hold left LE Sit to Supine: 4: Min assist;HOB flat Sit to Supine - Details (indicate cue type and reason): for left LE, cues for positioning in bed Transfers Sit to Stand: 4: Min assist;With upper extremity assist;From chair/3-in-1;From bed;With armrests Sit to Stand Details (indicate cue type and reason): cues for hand placement Stand to Sit: With upper extremity assist;To bed;4: Min assist Stand to Sit Details: steadying assist Ambulation/Gait Ambulation/Gait Assistance: 4: Min assist Ambulation/Gait Assistance Details (indicate cue type and reason): steadying assist for balance Ambulation Distance (Feet): 90 Feet Assistive device: Rolling walker Gait Pattern: Decreased stance time - left;Decreased step length - right;Step-to pattern;Antalgic    Exercise  Total Joint Exercises Ankle Circles/Pumps: AROM;Both;20 reps;Supine Quad Sets: AROM;Left;10 reps;Supine Short Arc Quad: AAROM;Left;10 reps;Supine Heel Slides: AAROM;Left;10 reps;Supine Hip ABduction/ADduction: AROM;Left;10 reps;Supine Straight Leg Raises: AAROM;Left;10 reps;Supine Issued handout with HEP. End of Session PT - End of Session Equipment Utilized During Treatment: Gait belt;Left knee immobilizer Activity Tolerance: Patient tolerated treatment well Patient left: in bed;with call bell in reach General Behavior During Session: [facility] for tasks performed Cognition: [facility] for tasks performed  [facility] 09/14/2011, 3:19 PM

## 2011-09-14 NOTE — Progress Notes (Signed)
CM spoke with pt concerning choice for [facility] services. Per pt choice [facility] to provide HHPT. CM notified [facility] of new pt referral  [name] [name] [phone]

## 2011-09-14 NOTE — Progress Notes (Signed)
In and out cath done. of clear amber urine removed from bladder. Pt tolerated procedure. Lonya Johannesen, rn.

## 2011-09-14 NOTE — Progress Notes (Signed)
PT Cancellation Note  Treatment cancelled today due to patient's refusal to participate.  Reports just worked with OT and had lots of pain last night.  Agreeable to try later today.  Ice applied and leg elevated on another pillow.   Will reattempt in pm.  WYNN,CYNDI 09/14/2011, 10:01 AM

## 2011-09-14 NOTE — Progress Notes (Signed)
Occupational Therapy Evaluation Patient Details Name: [name] [name] MRN: [id number] DOB: 08-19-1921 Today's Date: 09/14/2011  Problem List: There is no problem list on file for this patient.   Past Medical History:  Past Medical History  Diagnosis Date  . Hypertension   . Coronary artery disease   . Stroke     TIA 05/2011   . GERD (gastroesophageal reflux disease)   . Arthritis     arthritis in knees   . Cancer     hx of basal cell carcinoma   . Myocardial infarction     2002, stent implanted    Past Surgical History:  Past Surgical History  Procedure Date  . Cardiac catheterization   . Coronary angioplasty     stent 2002   . Hernia repair     bilateral     OT Assessment/Plan/Recommendation OT Assessment Clinical Impression Statement: Pt is an 78 yo s/p L TKA. WBAT. Ambulating with KI. Pt presents with decreased strength and endurance and knowledge of AE and DME. Pt will benefit from OT in acute care setting to max indep and safety with ADL and functional mobility for ADL to facilitate safe D/C home and reach below established goals. Pt plans to go home with help from family. Pt will most likely not need HHOT. Pt needs a BSC for D/C. OT Recommendation/Assessment: Patient will need skilled OT in the acute care venue OT Problem List: Decreased strength;Decreased range of motion;Decreased activity tolerance;Decreased safety awareness;Decreased knowledge of use of DME or AE;Pain Barriers to Discharge: None OT Therapy Diagnosis : Generalized weakness OT Plan OT Frequency: Min 2X/week OT Treatment/Interventions: Self-care/ADL training;Therapeutic exercise;Energy conservation;DME and/or AE instruction;Therapeutic activities;Patient/family education OT Recommendation Follow Up Recommendations: No OT follow up Equipment Recommended: 3 in 1 bedside comode Individuals Consulted Consulted and Agree with Results and Recommendations: Patient OT Goals Acute Rehab OT Goals OT  Goal Formulation: With patient Time For Goal Achievement: 2 weeks ADL Goals Pt Will Perform Lower Body Bathing: with set-up;Sit to stand from chair;with adaptive equipment ADL Goal: Lower Body Bathing - Progress: Goal set today Pt Will Transfer to Toilet: with modified independence;3-in-1;with DME ADL Goal: Toilet Transfer - Progress: Goal set today Pt Will Perform Tub/Shower Transfer: with supervision;with DME;Ambulation;Shower transfer;Other (comment) (3n1) ADL Goal: Tub/Shower Transfer - Progress: Goal set today  OT Evaluation Precautions/Restrictions  Precautions Precautions: Knee Required Braces or Orthoses: Yes Knee Immobilizer: On when out of bed or walking;Discontinue once straight leg raise with < 10 degree lag Restrictions Weight Bearing Restrictions: Yes LLE Weight Bearing: Weight bearing as tolerated Prior Functioning Home Living Lives With: Alone Receives Help From: Family Type of Home: Mobile home Home Layout: One level Home Access: Stairs to enter Entrance Stairs-Rails: Left;Right;Can reach both Entrance Stairs-Number of Steps: 4 Bathroom Shower/Tub: Tub/shower unit;Walk-in shower Bathroom Toilet: Standard Bathroom Accessibility: Yes How Accessible: Accessible via walker Home Adaptive Equipment: None Prior Function Level of Independence: Independent with basic ADLs;Independent with transfers;Independent with gait;Independent with homemaking with ambulation Able to Take Stairs?: Yes Driving: Yes Vocation: Unemployed ADL ADL Eating/Feeding: Independent Where Assessed - Eating/Feeding: Edge of bed Grooming: Set up;Simulated Where Assessed - Grooming: Sitting, chair Upper Body Bathing: Simulated;Set up Where Assessed - Upper Body Bathing: Sitting, chair Lower Body Bathing: Minimal assistance;Simulated Where Assessed - Lower Body Bathing: Sit to stand from chair Upper Body Dressing: Simulated;Set up Where Assessed - Upper Body Dressing: Sitting, chair Lower  Body Dressing: Minimal assistance;Simulated Where Assessed - Lower Body Dressing: Sit to stand from chair Toilet  Transfer: Performed;Minimal Dentist Method: Proofreader: Set designer - Clothing Manipulation: Independent Where Assessed - Glass blower/designer Manipulation: Standing Toileting - Hygiene: Independent Where Assessed - Toileting Hygiene: Sit to stand from 3-in-1 or toilet;Standing Tub/Shower Transfer: Not assessed (educated pt on shwer transfer technique) Tub/Shower Transfer Method: Ambulating (back into shower) Psychologist, educational: Walk in shower [facility]) Equipment Used: Rolling walker Ambulation Related to ADLs: Min A. Pt mostly limited by pain. ADL Comments: Pt will have assistance from family after D/C to help with ADL as needed. Vision/Perception  Vision - History Baseline Vision: No visual deficits Perception Perception: Within Functional Limits Praxis Praxis: Intact Cognition Cognition Arousal/Alertness: Awake/alert Overall Cognitive Status: Appears within functional limits for tasks assessed Orientation Level: Oriented X4 Sensation/Coordination Sensation Light Touch: Appears Intact Proprioception: Appears Intact Coordination Gross Motor Movements are Fluid and Coordinated: Yes Fine Motor Movements are Fluid and Coordinated: Yes Extremity Assessment RUE Assessment RUE Assessment: Within Functional Limits LUE Assessment LUE Assessment: Within Functional Limits Mobility  Bed Mobility Bed Mobility: Yes Supine to Sit: 5: Supervision Sitting - Scoot to Edge of Bed: 6: Modified independent (Device/Increase time) Transfers Transfers: Yes Sit to Stand: 4: Min assist;With upper extremity assist;From bed Stand to Sit: 4: Min assist;With upper extremity assist;To bed   End of Session OT - End of Session Equipment Utilized During Treatment: Gait belt;Left knee immobilizer Activity Tolerance: Patient  limited by pain Patient left: in chair;with call bell in reach Nurse Communication: Mobility status for transfers General Behavior During Session: [facility] for tasks performed Cognition: [facility] for tasks performed   [facility] 09/14/2011, 12:49 PM  [facility], OTR/L  [phone] 09/14/2011

## 2011-09-14 NOTE — Progress Notes (Signed)
Foley cath [facility] Pt noted to not void but 50 ml. Bladder scan done. Pt noted to be retaining greater than of urine. In and out cath per protocol order implemented.

## 2011-09-15 LAB — CBC
HCT: 29.6 % — ABNORMAL LOW (ref 39.0–52.0)
Hemoglobin: 10.7 g/dL — ABNORMAL LOW (ref 13.0–17.0)
MCH: 31.5 pg (ref 26.0–34.0)
MCHC: 36.1 g/dL — ABNORMAL HIGH (ref 30.0–36.0)
MCV: 87.1 fL (ref 78.0–100.0)
RBC: 3.4 MIL/uL — ABNORMAL LOW (ref 4.22–5.81)

## 2011-09-15 LAB — BASIC METABOLIC PANEL
BUN: 14 mg/dL (ref 6–23)
CO2: 26 mEq/L (ref 19–32)
Calcium: 8.6 mg/dL (ref 8.4–10.5)
Creatinine, Ser: 0.91 mg/dL (ref 0.50–1.35)
GFR calc non Af Amer: 73 mL/min — ABNORMAL LOW (ref >90)
Glucose, Bld: 157 mg/dL — ABNORMAL HIGH (ref 70–99)

## 2011-09-15 MED ORDER — OXYCODONE HCL 5 MG PO TABS
15.0000 mg | ORAL_TABLET | ORAL | Status: DC | PRN
  Administered 2011-09-15 (×2): 15 mg via ORAL
  Filled 2011-09-15 (×2): qty 3

## 2011-09-15 MED ORDER — OXYCODONE HCL 10 MG PO TABS: 10.0000 mg | ORAL_TABLET | ORAL | Status: AC | PRN

## 2011-09-15 MED ORDER — ENOXAPARIN SODIUM 30 MG/0.3ML ~~LOC~~ SOLN: 30.0000 mg | SUBCUTANEOUS | Status: DC

## 2011-09-15 NOTE — Progress Notes (Signed)
Physical Therapy Treatment Patient Details Name: BREVYN RING MRN: [id number] DOB: 1921-01-20 Today's Date: 09/15/2011  [id number] TE  PT Assessment/Plan  PT - Assessment/Plan Comments on Treatment Session: Pt states that his pain is getting under control with new pain medication, however pt states that he did not want to ambulate until the afternoon.  Tolerated exercises well.  Pt to D/C this afternoon per pt if pain is well controlled.  PT to ambulate/stair train pt in pm.   PT Plan: Discharge plan remains appropriate PT Frequency: 7X/week Follow Up Recommendations: Home health PT;Supervision/Assistance - 24 hour Equipment Recommended: Rolling walker with 5" wheels PT Goals  Acute Rehab PT Goals PT Goal Formulation: With patient Time For Goal Achievement: 5 days Pt will Perform Home Exercise Program: with supervision, verbal cues required/provided PT Goal: Perform Home Exercise Program - Progress: Progressing toward goal  PT Treatment Precautions/Restrictions  Precautions Precautions: Knee Required Braces or Orthoses: Yes Knee Immobilizer: On except when in CPM;On when out of bed or walking;Discontinue once straight leg raise with < 10 degree lag Restrictions Weight Bearing Restrictions: Yes LLE Weight Bearing: Weight bearing as tolerated Mobility (including Balance) Bed Mobility Bed Mobility: No    Exercise  Total Joint Exercises Ankle Circles/Pumps: AROM;Both;20 reps;Supine Quad Sets: AROM;Left;10 reps;Supine Short Arc Quad: AAROM;Left;10 reps;Supine Heel Slides: AAROM;Left;10 reps;Supine Hip ABduction/ADduction: AROM;Left;10 reps;Supine Straight Leg Raises: AAROM;Left;10 reps;Supine End of Session PT - End of Session Activity Tolerance: Patient tolerated treatment well Patient left: in bed;with call bell in reach General Behavior During Session: [facility] for tasks performed Cognition: [facility] for tasks performed  Page, Meribeth Mattes 09/15/2011, 10:17 AM

## 2011-09-15 NOTE — Progress Notes (Signed)
Subjective: 3 Days Post-Op Procedure(s) (LRB): TOTAL KNEE ARTHROPLASTY (Left) Patient reports pain as 5 on 0-10 scale.   Denies CP or SOB.  Voiding without difficulty. Positive flatus.  There were some issues with pain control over the weekend but pt feels better today. Objective: Vital signs in last 24 hours: Temp:  [98.1 F (36.7 C)-98.3 F (36.8 C)] 98.1 F (36.7 C) (01/21 0507) Pulse Rate:  [76-89] 78  (01/21 0507) Resp:  [16-18] 16  (01/21 0507) BP: (114-132)/(56-63) 121/56 mmHg (01/21 0507) SpO2:  [90 %-98 %] 94 % (01/21 0507)  Intake/Output from previous day: 01/20 0701 - 01/21 0700 In: 694.8 [P.O.:360; I.V.:334.8] Out: 1075 [Urine:1075] Intake/Output this shift:     Basename 09/15/11 0454 09/14/11 0608 09/13/11 0450  HGB 10.7* 11.5* 12.8*    Basename 09/15/11 0454 09/14/11 0608  WBC 8.3 11.8*  RBC 3.40* 3.72*  HCT 29.6* 32.4*  PLT 108* 130*    Basename 09/15/11 0454 09/14/11 0608  NA 129* 133*  K 3.9 4.3  CL 93* 97  CO2 26 26  BUN 14 15  CREATININE 0.91 0.91  GLUCOSE 157* 157*  CALCIUM 8.6 9.0   No results found for this basename: LABPT:2,INR:2 in the last 72 hours  Neurologically intact Neurovascular intact Sensation intact distally Incision: no drainage and mild erythema over patella, mild edema Compartment soft  Assessment/Plan: 3 Days Post-Op Procedure(s) (LRB): TOTAL KNEE ARTHROPLASTY (Left) Up with therapy Discharge home with home health later today if pain well controlled Discussed strict elevation with the patient Lovenox for DVT prevention  Shea Swalley R. 09/15/2011, 9:12 AM

## 2011-09-15 NOTE — Op Note (Signed)
NAME:  SEANPATRICK, MAISANO NO.:  [id number]  MEDICAL RECORD NO.:  [id number]  LOCATION:                               FACILITY:  [facility]  PHYSICIAN:  Jene Every, M.D.    DATE OF BIRTH:  [birth year]  DATE OF PROCEDURE: DATE OF DISCHARGE:                              OPERATIVE REPORT   PREOPERATIVE DIAGNOSIS:  Degenerative joint disease of the left knee.  POSTOPERATIVE DIAGNOSIS:  Degenerative joint disease of the left knee.  PROCEDURE PERFORMED:  Left total knee arthroplasty.  ANESTHESIA:  General.  ASSISTANT:  Roma Schanz, P.A.  COMPONENTS:  DePuy rotating platform, 5 femur, 4 tibia, 10 mm insert, 38 patella.  HISTORY:  An 78 year old with end-stage osteoarthrosis of the knee, disabling, indicated for placement of degenerated joint bone-on-bone arthrosis.  Risks and benefits were discussed including bleeding, infection, damage to neurovascular structures, no change in symptoms, worsening symptoms, need for repeat manipulation, DVT, PE, anesthetic complications, etc.  TECHNIQUE:  With the patient in supine position, after induction of adequate general anesthesia, 2 g Kefzol, left lower extremity was prepped and draped and exsanguinated in usual sterile fashion.  Thigh tourniquet inflated to 300 mmHg.  Knee flexed.  Midline incision was made.  Full thickness flaps developed.  Median parapatellar arthrotomy was performed.  Patella was everted, the knee was flexed.  Soft tissues elevated medially.  Remnants of menisci and ACL were removed. Tricompartmental osteoarthrosis was noted.  Step drill was utilized to enter the femoral canal, 5 degree left utilized, pinned, 12 off the distal femur, __________ flexion contracture.  Oscillating saw performed a distal cut.  We then sized off the anterior cortex of the femur to a 5, pinned 3 degrees of external rotation and anterior posterior chamfer cuts were performed. Soft tissue was protected with Crego at all  times.  Attention was turned towards the tibia, subluxed.  External alignment guide utilized 10 off the high side which was lateral, parallel to the tibia, bisecting the ankle.  Pinned, proximal tibial cut performed.  We then used a flexion- extension blocks with protecting the soft tissues with __________. Flexion-extension blocks were equivalent at 10.  We then completed the tibia, sized to a 4 just the medial aspect of the tibial tubercle, maximizing the coverage, pinned, central drill hole, and punch guide utilized.  Completed the femur with a box cut with the jig bisecting the canal and the condyles.  Box cut was performed.  We then placed a trial femur and tibia, 10 mm insert, full extension, full flexion, good stability, varus valgus stressing 0-30 degrees.  Patella was everted, it measured to a 22.  We used a patellar planer marked to a 14. Oscillating saw was performed.  We drilled 3 peg holes, medializing the patella.  We placed a patellar component trial, reduced in good patellofemoral tracking.  All instrumentation was removed.  Used pulsatile lavage for irrigation.  Checked posteriorly, removed any remnants of the menisci.  We cauterized the geniculate, tibia flexed, __________ used.  Surfaces dried and mixed cement on the back table in appropriate fashion, injected into the proximal tibia.  Digitally pressurized the canal and impacted the 4 tibia, cemented the femur,  redundant cement removed.  This was impacted, placed a trial insert, reduced, and an axial load applied throughout the course of the case. Patella was cemented as well.  After appropriate curing of the cement, we removed the trials.  We inspected the joint, redundant cement removed.  Just prior to that, we had full extension, full flexion, and good stability.  After removing redundant cement and copiously irrigating the wound, we placed a 10 mm permanent insert, reduced it.  I had full extension, full flexion,  good stability, varus valgus stressing 0-30 degrees.  Good patellofemoral tracking.  Therefore, placed Hemovac, brought out through a lateral stab wound in the skin.  Copiously irrigated the wound, closed the patellar arthrotomy with #1 Vicryl interrupted figure-of-eight sutures, subcutaneous with 0 and 2-0 Vicryl simple sutures.  Skin was reapproximated with staples.  She had flexion to gravity at 90 degrees.  Wound was sterilely dressed.  Tourniquet was deflated and there was adequate revascularization appreciated. The patient tolerated the procedure well.  No complications.  Tourniquet time was 86 minutes.     Jene Every, M.D.     Cordelia Pen  D:  09/12/2011  T:  09/13/2011  Job:  [id number]

## 2011-09-15 NOTE — Progress Notes (Signed)
Pt had been advised by a staff member (he thought it was the doctor or case manager) that a walker would be delivered to his room prior to discharge.  I contacted [facility] [phone] and spoke with Selena Batten.  She stated no order or prescription had been received for a walker.    I contacted Dr. Ermelinda Das office and spoke with Marchelle Folks.  She paged the doctor with a request to fax a prescription directly to [facility] so a walker can be delivered to their home this afternoon.  Kim with [facility] called me back to confirm the above and stated she would follow up to make sure the pt received their walker this afternoon.    Discussed all the above with pt and family and they state they have a walker at home they can use in case the pt's walker cannot be delivered tonight.  They would like to be discharged and feel comfortable going home without the pt's own walker.  Discharged pt home.  Ardyth Gal, RN 09/15/2011

## 2011-09-15 NOTE — Discharge Summary (Signed)
Physician Discharge Summary  Patient ID: [name] [name] MRN: [id number] DOB/AGE: 76-Nov-1922 76 y.o.  Admit date: 09/12/2011 Discharge date: 09/15/2011  Admission Diagnoses: OA right knee TIA (2012)  HTN  CAD  OA  Hearing impaired  MI 2002   Discharge Diagnoses:  OA right knee s/p TKA Resolved urinary retention  TIA (2012)  HTN  CAD  OA  Hearing impaired  MI 2002    Discharged Condition: good  Hospital Course:  Pt did well post op.  No complications.  Some mild issues with pain control but medications were adjusted prior to discharge.  Patient progressed well with PT. See chart for complete details.  Basename 09/15/11 0454 09/14/11 0608  WBC 8.3 11.8*  HCT 29.6* 32.4*  PLT 108* 130*    Basename 09/15/11 0454 09/14/11 0608  NA 129* 133*  K 3.9 4.3  CL 93* 97  CO2 26 26  GLUCOSE 157* 157*  BUN 14 15  CREATININE 0.91 0.91  CALCIUM 8.6 9.0    Procedure Note: PRE-OPERATIVE DIAGNOSIS: Degenerative joint disease left knee  POST-OPERATIVE DIAGNOSIS: Degenerative joint disease left knee  PROCEDURE: Procedure(s):  TOTAL KNEE ARTHROPLASTY  SURGEON: Surgeon(s):  Javier Docker, MD  PHYSICIAN ASSISTANT:  ASSISTANTS: Keeyon Privitera  ANESTHESIA: spinal  EBL: Total I/O  In: 1000 [I.V.:1000]  Out: 325 [Urine:275; Blood:50]  BLOOD ADMINISTERED:none  DRAINS: hemovac  LOCAL MEDICATIONS USED: MARCAINE   Consults: none Disposition:  Home with home health Discharge Orders    Future Orders Please Complete By Expires   Diet - low sodium heart healthy      Call MD / Call 911      Comments:   If you experience chest pain or shortness of breath, CALL 911 and be transported to the hospital emergency room.  If you develope a fever above 101 F, pus (white drainage) or increased drainage or redness at the wound, or calf pain, call your surgeon's office.   Constipation Prevention      Comments:   Drink plenty of fluids.  Prune juice may be helpful.  You may use a stool  softener, such as Colace (over the counter) 100 mg twice a day.  Use MiraLax (over the counter) for constipation as needed.   Increase activity slowly as tolerated      Weight Bearing as taught in Physical Therapy      Comments:   Use a walker or crutches as instructed.   Discharge instructions      Comments:   Use knee immobilizer until can SLR x 10 Elevate above heart level at least 6 x a day for 20 minutes Daily dressing change OK to shower     Driving restrictions      Comments:   No driving for 4-6 weeks   TED hose      Comments:   Use stockings (TED hose) for 2 weeks on both leg(s).  You may remove them at night for sleeping.     Current Discharge Medication List    START taking these medications   Details  enoxaparin (LOVENOX) 30 MG/0.3ML SOLN Inject 0.3 mLs (30 mg total) into the skin daily. Qty: 11 Syringe, Refills: 0    oxyCODONE 10 MG TABS Take 1 tablet (10 mg total) by mouth every 3 (three) hours as needed. Qty: 90 tablet, Refills: 0      CONTINUE these medications which have NOT CHANGED   Details  amLODipine (NORVASC) 5 MG tablet Take 5 mg by mouth daily before breakfast.  clopidogrel (PLAVIX) 75 MG tablet Take 75 mg by mouth daily.    isosorbide mononitrate (IMDUR) 30 MG 24 hr tablet Take 30 mg by mouth daily before breakfast.     Magnesium 500 MG CAPS Take 1 capsule by mouth daily.     metoprolol succinate (TOPROL-XL) 25 MG 24 hr tablet Take 25 mg by mouth daily before breakfast.     Multiple Vitamin (MULITIVITAMIN WITH MINERALS) TABS Take 1 tablet by mouth daily.     ramipril (ALTACE) 10 MG capsule Take 20 mg by mouth daily before breakfast.     rosuvastatin (CRESTOR) 40 MG tablet Take 40 mg by mouth at bedtime.     aspirin EC 81 MG tablet Take 81 mg by mouth daily before breakfast.     naproxen sodium (ANAPROX) 220 MG tablet Take 440 mg by mouth 2 (two) times daily as needed. PAIN       STOP taking these medications     fish oil-omega-3  fatty acids 1000 MG capsule        Follow-up Information    Follow up with [facility]. (Home PT,OT,RN)    Contact information:   [phone]      Follow up with BEANE,JEFFREY C, MD in 11 days.   Contact information:   [facility] [address], [address] [place] [address] [phone]          Signed: Liam Graham. 09/15/2011, 9:15 AM

## 2011-09-15 NOTE — Progress Notes (Signed)
Physical Therapy Treatment Patient Details Name: [name] [name] MRN: [id number] DOB: 03-20-1921 Today's Date: 09/15/2011  [id number] G  PT Assessment/Plan  PT - Assessment/Plan Comments on Treatment Session: Pt continues to have pain under control, however fatigues easily following stair training.  Daughter present during session, educated her on stair management.  Both verbalize understanding.  Pt to D/C today with HHPT and 24 hr supervision.  PT Plan: Discharge plan remains appropriate PT Frequency: 7X/week Follow Up Recommendations: Home health PT;Supervision/Assistance - 24 hour Equipment Recommended: Rolling walker with 5" wheels PT Goals  Acute Rehab PT Goals PT Goal Formulation: With patient Time For Goal Achievement: 5 days Pt will go Supine/Side to Sit: with modified independence PT Goal: Supine/Side to Sit - Progress: Progressing toward goal Pt will go Sit to Stand: with supervision PT Goal: Sit to Stand - Progress: Progressing toward goal Pt will go Stand to Sit: with supervision PT Goal: Stand to Sit - Progress: Met Pt will Ambulate: with supervision;>150 feet;with rolling walker PT Goal: Ambulate - Progress: Progressing toward goal Pt will Go Up / Down Stairs: 3-5 stairs;with rail(s);with min assist PT Goal: Up/Down Stairs - Progress: Met  PT Treatment Precautions/Restrictions  Precautions Precautions: Knee Required Braces or Orthoses: Yes Knee Immobilizer: On except when in CPM;On when out of bed or walking;Discontinue once straight leg raise with < 10 degree lag Restrictions Weight Bearing Restrictions: Yes LLE Weight Bearing: Weight bearing as tolerated Mobility (including Balance) Bed Mobility Bed Mobility: Yes Supine to Sit: 5: Supervision;HOB elevated (Comment degrees) (HOB elevated approx 30 deg) Supine to Sit Details (indicate cue type and reason): Supervision for safety Sitting - Scoot to Edge of Bed: 6: Modified independent (Device/Increase  time) Sitting - Scoot to Edge of Bed Details (indicate cue type and reason): Mod I for increased time Transfers Transfers: Yes Sit to Stand: 4: Min assist;With upper extremity assist;From bed Sit to Stand Details (indicate cue type and reason): Min guard for safety with cues for hand placement.  Stand to Sit: 5: Supervision;With upper extremity assist;With armrests;To chair/3-in-1 Stand to Sit Details: Requires cues for hand placement and LLE management.  Ambulation/Gait Ambulation/Gait: Yes Ambulation/Gait Assistance: 4: Min assist Ambulation/Gait Assistance Details (indicate cue type and reason): Min guard for slight imbalance with cues for sequencing/technique of LE's with RW.  Ambulation Distance (Feet): 40 Feet Assistive device: Rolling walker Gait Pattern: Step-to pattern;Decreased step length - right;Decreased stance time - left;Trunk flexed Gait velocity: decreased Stairs: Yes Stairs Assistance: 4: Min assist Stairs Assistance Details (indicate cue type and reason): Requires constant cuing for sequencing/technique.  Daughter was present, educated on stair management.  Stair Management Technique: Two rails;Step to pattern;Forwards Number of Stairs: 4  Height of Stairs: 6     Exercise    End of Session PT - End of Session Equipment Utilized During Treatment: Left knee immobilizer Activity Tolerance: Patient limited by fatigue Patient left: in chair;with call bell in reach;with family/visitor present General Behavior During Session: [facility] for tasks performed Cognition: [facility] for tasks performed  Page, Meribeth Mattes 09/15/2011, 3:04 PM

## 2011-09-16 ENCOUNTER — Encounter (HOSPITAL_COMMUNITY): Payer: Self-pay | Admitting: Specialist

## 2011-09-16 MED FILL — Bupivacaine Inj 0.5% w/ Epinephrine 1:200000 (PF): INTRAMUSCULAR | Qty: 10 | Status: AC

## 2011-09-16 NOTE — Progress Notes (Signed)
  CARE MANAGEMENT NOTE 09/16/2011  Patient:  [name], [name]   Account Number:  [id number]  Date Initiated:  09/13/2011  Documentation initiated by:  DAVIS,TYMEEKA  Subjective/Objective Assessment:   78 YO MALE ADMITTED WITH LFT TOTAL KNEE ARTHROPLASTY.PTA PT LIVED ALONE     Action/Plan:   HOME WHEN STABLE   Anticipated DC Date:  09/15/2011   Anticipated DC Plan:  HOME W HOME HEALTH SERVICES  In-house referral  NA      DC Planning Services  CM consult      [facility] Choice  HOME HEALTH   Choice offered to / List presented to:  C-1 Patient   DME arranged  NA      DME agency  NA     HH arranged  HH-2 PT      HH agency  [facility]   Status of service:  Completed, signed off Medicare Important Message given?  NA - LOS <3 / Initial given by admissions (If response is "NO", the following Medicare IM given date fields will be blank) Date Medicare IM given:   Date Additional Medicare IM given:    Discharge Disposition:  HOME W HOME HEALTH SERVICES  Per UR Regulation:    Comments:  09/16/2011 Raynelle Bring BSN CCM [phone] [facility] notified of patient's discharge 09/15/11. Start of [facility] services 09/17/11 per Maureen Ralphs at [place].

## 2011-09-17 ENCOUNTER — Emergency Department: Payer: Self-pay | Admitting: Emergency Medicine

## 2011-09-17 LAB — CBC
MCH: 32.6 pg (ref 26.0–34.0)
MCV: 94 fL (ref 80–100)
Platelet: 164 10*3/uL (ref 150–440)
RDW: 12.7 % (ref 11.5–14.5)
WBC: 9.9 10*3/uL (ref 3.8–10.6)

## 2011-09-17 LAB — URINALYSIS, COMPLETE
Bacteria: NONE SEEN
Bilirubin,UR: NEGATIVE
Blood: NEGATIVE
Glucose,UR: NEGATIVE mg/dL (ref 0–75)
Leukocyte Esterase: NEGATIVE
Nitrite: NEGATIVE
Specific Gravity: 1.016 (ref 1.003–1.030)
Squamous Epithelial: NONE SEEN
WBC UR: 3 /HPF (ref 0–5)

## 2011-09-17 LAB — BASIC METABOLIC PANEL
Calcium, Total: 8.8 mg/dL (ref 8.5–10.1)
Co2: 26 mmol/L (ref 21–32)
Creatinine: 1.37 mg/dL — ABNORMAL HIGH (ref 0.60–1.30)
EGFR (African American): >60
EGFR (Non-African Amer.): 52 — ABNORMAL LOW
Glucose: 176 mg/dL — ABNORMAL HIGH (ref 65–99)
Potassium: 4.6 mmol/L (ref 3.5–5.1)
Sodium: 132 mmol/L — ABNORMAL LOW (ref 136–145)

## 2013-01-28 ENCOUNTER — Telehealth: Payer: Self-pay | Admitting: Cardiovascular Disease

## 2013-01-28 MED ORDER — CLOPIDOGREL BISULFATE 75 MG PO TABS: 75.0000 mg | ORAL_TABLET | Freq: Every day | ORAL | Status: DC

## 2013-01-28 MED ORDER — ISOSORBIDE MONONITRATE ER 30 MG PO TB24: 30.0000 mg | ORAL_TABLET | Freq: Every day | ORAL | Status: DC

## 2013-01-28 MED ORDER — ROSUVASTATIN CALCIUM 40 MG PO TABS: 40.0000 mg | ORAL_TABLET | Freq: Every day | ORAL | Status: DC

## 2013-01-28 MED ORDER — METOPROLOL SUCCINATE ER 25 MG PO TB24: 25.0000 mg | ORAL_TABLET | Freq: Every day | ORAL | Status: DC

## 2013-01-28 NOTE — Telephone Encounter (Signed)
Spoke to patient regarding medication refills. 30 day supplies sent [facility] to [facility] Plavix, Imdur, Toprol, and Crestor. 90 day supplies for Imdur and Plavix were sent to the [facility] mail order pharmacy.
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
## 2013-02-17 ENCOUNTER — Telehealth: Payer: Self-pay | Admitting: Cardiovascular Disease

## 2013-02-17 MED ORDER — CLOPIDOGREL BISULFATE 75 MG PO TABS: 75.0000 mg | ORAL_TABLET | Freq: Every day | ORAL | Status: DC

## 2013-02-17 MED ORDER — ISOSORBIDE MONONITRATE ER 30 MG PO TB24: 30.0000 mg | ORAL_TABLET | Freq: Every day | ORAL | Status: DC
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
## 2013-03-18 ENCOUNTER — Other Ambulatory Visit: Payer: Self-pay | Admitting: Cardiovascular Disease

## 2013-03-21 NOTE — Telephone Encounter (Signed)
Rx was sent to pharmacy electronically. 

## 2013-03-27 ENCOUNTER — Encounter: Payer: Self-pay | Admitting: *Deleted

## 2013-03-28 ENCOUNTER — Encounter: Payer: Self-pay | Admitting: Cardiovascular Disease

## 2013-03-29 ENCOUNTER — Ambulatory Visit (INDEPENDENT_AMBULATORY_CARE_PROVIDER_SITE_OTHER): Payer: Medicare Other | Admitting: Cardiovascular Disease

## 2013-03-29 ENCOUNTER — Encounter: Payer: Self-pay | Admitting: Cardiovascular Disease

## 2013-03-29 VITALS — BP 128/60 | HR 48 | Ht 68.0 in | Wt 178.6 lb

## 2013-03-29 DIAGNOSIS — E785 Hyperlipidemia, unspecified: Secondary | ICD-10-CM | POA: Insufficient documentation

## 2013-03-29 DIAGNOSIS — I2510 Atherosclerotic heart disease of native coronary artery without angina pectoris: Secondary | ICD-10-CM

## 2013-03-29 DIAGNOSIS — R001 Bradycardia, unspecified: Secondary | ICD-10-CM | POA: Insufficient documentation

## 2013-03-29 DIAGNOSIS — I498 Other specified cardiac arrhythmias: Secondary | ICD-10-CM

## 2013-03-29 DIAGNOSIS — Z9861 Coronary angioplasty status: Secondary | ICD-10-CM | POA: Insufficient documentation

## 2013-03-29 DIAGNOSIS — I10 Essential (primary) hypertension: Secondary | ICD-10-CM

## 2013-03-29 HISTORY — DX: Atherosclerotic heart disease of native coronary artery without angina pectoris: I25.10

## 2013-03-29 HISTORY — DX: Hyperlipidemia, unspecified: E78.5

## 2013-03-29 HISTORY — DX: Bradycardia, unspecified: R00.1

## 2013-03-29 HISTORY — DX: Essential (primary) hypertension: I10

## 2013-03-29 NOTE — Progress Notes (Signed)
Patient ID: [name] [name], male   DOB: 03/28/1921, 79 y.o.   MRN: [id number]     HPI: [name] [name], is a 79 y.o. male who presents to the office today for six-month cardiology evaluation. [name]. 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 Demonstrated to have significant myocardial salvage and only has a small area of residual scarring in the anteroapical, septal, apical walls. His last nuclear perfusion study in December 2012 which was done prior to knee replacement surgery only showed a small region of distal LAD scar without ischemia. [name]. Birdsell continues to feel well. He denies recurrent chest pain. He remains active. He denies shortness of breath. He denies palpitations or presyncope.  Past Medical History  Diagnosis Date  . Stroke     TIA 05/2011   . GERD (gastroesophageal reflux disease)   . Arthritis     arthritis in knees   . Cancer     hx of basal cell carcinoma   . Myocardial infarction     2002, stent implanted   . Hypertension 09/19/10    ECHO-EF53% The transmitral spectral doppler flow pattern suggestive of impairded LV relaxation--likely normal for age with no evidence of ^ LA pressures or LV filling pressures. IVC is dilated, measureing 2.2cm, but unable to assess respirophasic variation.The dilated IVC would suggest ^ CVP/RAP of ~ 10-15 mmHg. RVSP is estimated at 25-35 mmHg consistant with borderline elevated pressures.  . Coronary artery disease 07/30/11    Nuclear stress test-post-test EF is 45% Lowrisk scan . when compard to previouc study there is no significant change.    Past Surgical History  Procedure Laterality Date  . Cardiac catheterization    . Coronary  angioplasty      stent 2002   . Hernia repair      bilateral   . Total knee arthroplasty  09/12/2011    Procedure: TOTAL KNEE ARTHROPLASTY;  Surgeon: Javier Docker, MD;  Location: [facility];  Service: Orthopedics;  Laterality: Left;    Allergies  Allergen Reactions  . Celebrex (Celecoxib)   . Vicodin (Hydrocodone-Acetaminophen) Other (See Comments)    NIGHTMARES     Current Outpatient Prescriptions  Medication Sig Dispense Refill  . amLODipine (NORVASC) 5 MG tablet TAKE 1 TABLET DAILY  90 tablet  2  . aspirin EC 81 MG tablet Take 81 mg by mouth daily before breakfast.       . clopidogrel (PLAVIX) 75 MG tablet Take 1 tablet (75 mg total) by mouth daily.  30 tablet  1  . isosorbide mononitrate (IMDUR) 30 MG 24 hr tablet Take 1 tablet (30 mg total) by mouth daily before breakfast.  30 tablet  1  . Magnesium 500 MG CAPS Take 1 capsule by mouth daily.       . metoprolol succinate (TOPROL-XL) 25 MG 24 hr tablet Take 1 tablet (25 mg total) by mouth daily before breakfast.  30 tablet  0  . Multiple Vitamin (MULITIVITAMIN WITH MINERALS) TABS Take 1 tablet by mouth daily.       . naproxen sodium (ANAPROX) 220 MG tablet Take 440 mg by mouth 2 (two) times daily as needed. PAIN       . Omega-3 Fatty Acids (FISH OIL) 1200 MG CAPS Take  2 capsules by mouth daily.      . ramipril (ALTACE) 10 MG capsule TAKE 2 CAPSULES DAILY  180 capsule  2  . rosuvastatin (CRESTOR) 40 MG tablet Take 1 tablet (40 mg total) by mouth at bedtime.  30 tablet  0   No current facility-administered medications for this visit.    History   Social History  . Marital Status: Single    Spouse Name: N/A    Number of Children: N/A  . Years of Education: N/A   Occupational History  . Not on file.   Social History Main Topics  . Smoking status: Former Smoker    Quit date: 08/25/1950  . Smokeless tobacco: Never Used  . Alcohol Use: No  . Drug Use: No  . Sexually Active: Not on file   Other Topics Concern  . Not on  file   Social History Narrative  . No narrative on file    Family history is notable for both parents and all siblings are deceased.  ROS is negative for fevers, chills or night sweats.  He denies chest pain. He denies PND orthopnea. There is no shortness of breath. He denies presyncope or syncope. He denies abdominal pain. His bleeding. He denies edema. He denies myalgias Other system review is negative.  PE BP 128/60  Pulse 48  Ht 5\' 8"  (1.727 m)  Wt 178 lb 9.6 oz (81.012 kg)  BMI 27.16 kg/m2  General: Alert, oriented, no distress.  Skin: normal turgor, no rashes HEENT: Normocephalic, atraumatic. Pupils round and reactive; sclera anicteric;no lid lag.  Nose without nasal septal hypertrophy Mouth/Parynx benign; Mallinpatti scale 2 Neck: No JVD, no carotid briuts Lungs: clear to ausculatation and percussion; no wheezing or rales Heart: RRR, s1 s2 normal 1/6 systolic Abdomen: soft, nontender; no hepatosplenomehaly, BS+; abdominal aorta nontender and not dilated by palpation. Pulses 2+ Extremities: no clubbing cyanosis or edema, Homan's sign negative  Neurologic: grossly nonfocal  ECG: Sinus bradycardia at 48 beats per minute with mild sinus arrhythmia. Poor anterior R-wave progression compatible with his old anterior wall MI.  LABS:  BMET    Component Value Date/Time   NA 129* 09/15/2011 0454   K 3.9 09/15/2011 0454   CL 93* 09/15/2011 0454   CO2 26 09/15/2011 0454   GLUCOSE 157* 09/15/2011 0454   BUN 14 09/15/2011 0454   CREATININE 0.91 09/15/2011 0454   CALCIUM 8.6 09/15/2011 0454   GFRNONAA 73* 09/15/2011 0454   GFRAA 85* 09/15/2011 0454     Hepatic Function Panel     Component Value Date/Time   PROT 7.6 09/04/2011 1025   ALBUMIN 4.4 09/04/2011 1025   AST 34 09/04/2011 1025   ALT 40 09/04/2011 1025   ALKPHOS 115 09/04/2011 1025   BILITOT 0.6 09/04/2011 1025     CBC    Component Value Date/Time   WBC 8.3 09/15/2011 0454   RBC 3.40* 09/15/2011 0454   HGB 10.7*  09/15/2011 0454   HCT 29.6* 09/15/2011 0454   PLT 108* 09/15/2011 0454   MCV 87.1 09/15/2011 0454   MCH 31.5 09/15/2011 0454   MCHC 36.1* 09/15/2011 0454   RDW 13.0 09/15/2011 0454   LYMPHSABS 2.0 09/04/2011 1025   MONOABS 0.7 09/04/2011 1025   EOSABS 0.2 09/04/2011 1025   BASOSABS 0.0 09/04/2011 1025     BNP No results found for this basename: probnp    Lipid Panel  No results found for this basename: chol, trig, hdl, cholhdl, vldl, ldlcalc  RADIOLOGY: No results found.    ASSESSMENT AND PLAN: [name] [name] is now 12 years following his anterior wall myocardial infarction with successful acute intervention and documentation of significant myocardial salvage. He does have only a small area of distal LAD scar. Clinically he remains active and is doing well without significant symptoms. His pulse today is slow at 48. I am recommending reduction of his Toprol dose from 25 mg to 12.5 mg daily. He did have complete set of laboratories done in February. At that time, his LDL cholesterol was 53 with total close to 129 he will undergo repeat blood work in 6 months and I will see him back in the office for followup evaluation.     [name] Bihari, MD, [facility]  03/29/2013 9:01 AM

## 2013-03-29 NOTE — Patient Instructions (Addendum)
Your physician recommends that you schedule a follow-up appointment in: 6 MONTHS. Your physician has recommended you make the following change in your medication: Decrease your metoprolol to 1/2 tablet daily.

## 2013-05-24 IMAGING — CR DG KNEE 1-2V*L*
2 series · 2 of 2 positions shown · non-contrast
Comparison: None.

CLINICAL DATA: Preop; left knee aching

LEFT KNEE - 1-2 VIEW

[t knee ap left]
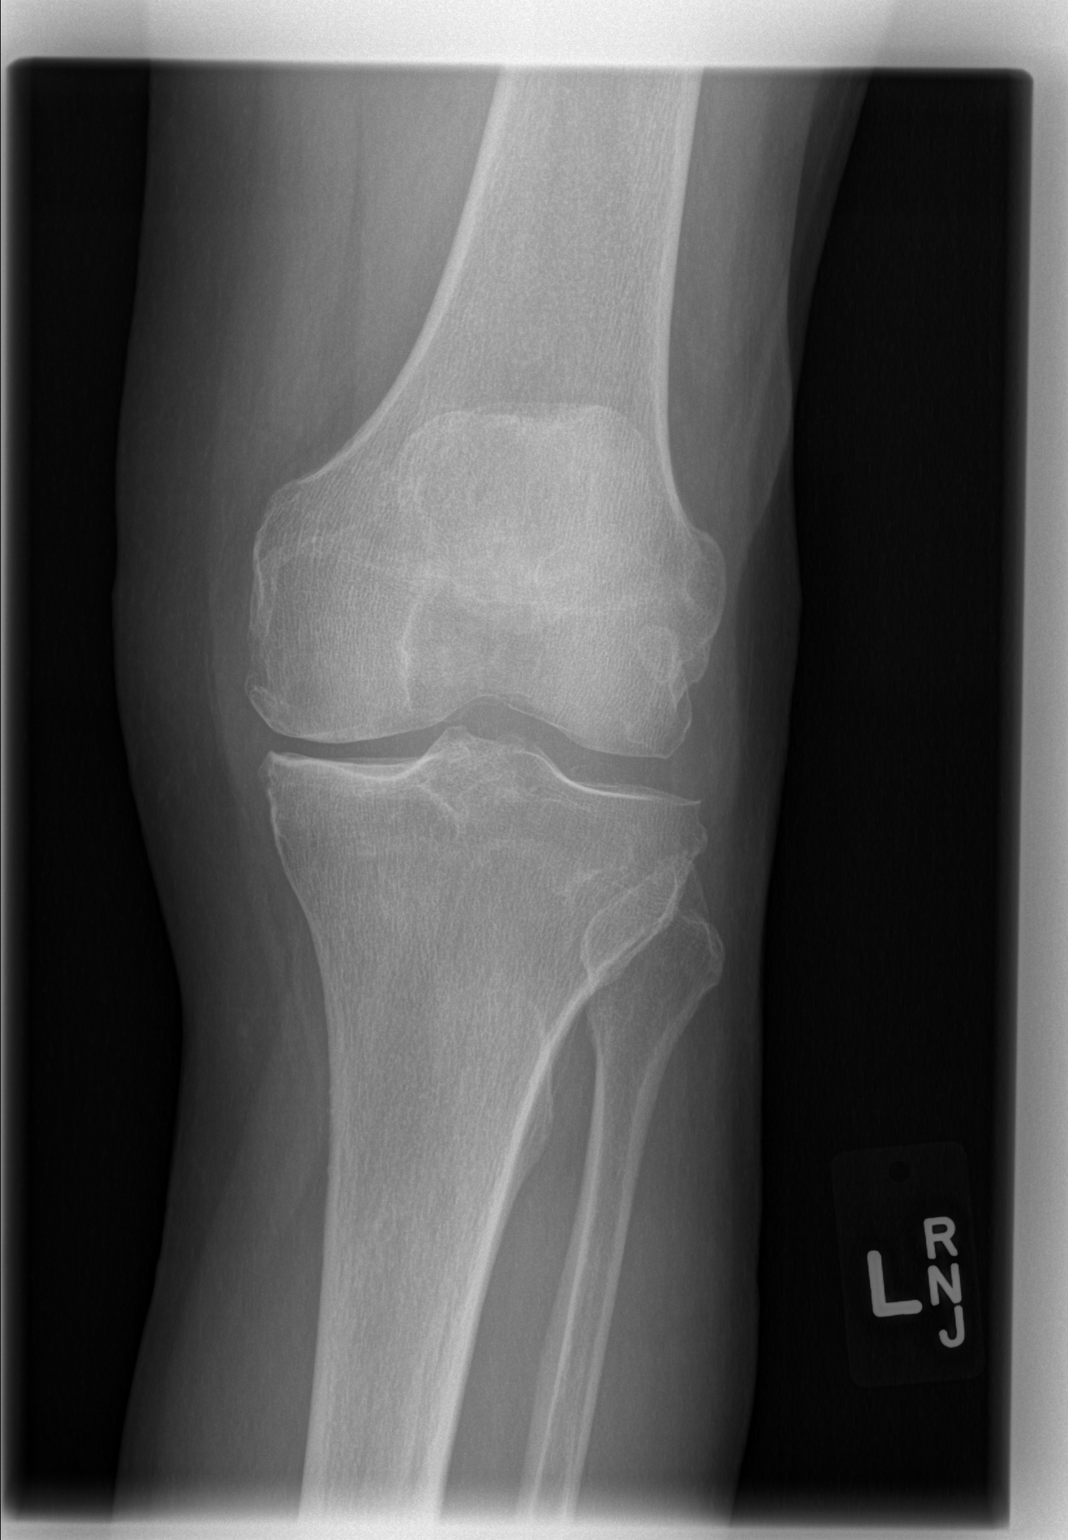

[t knee lat left]
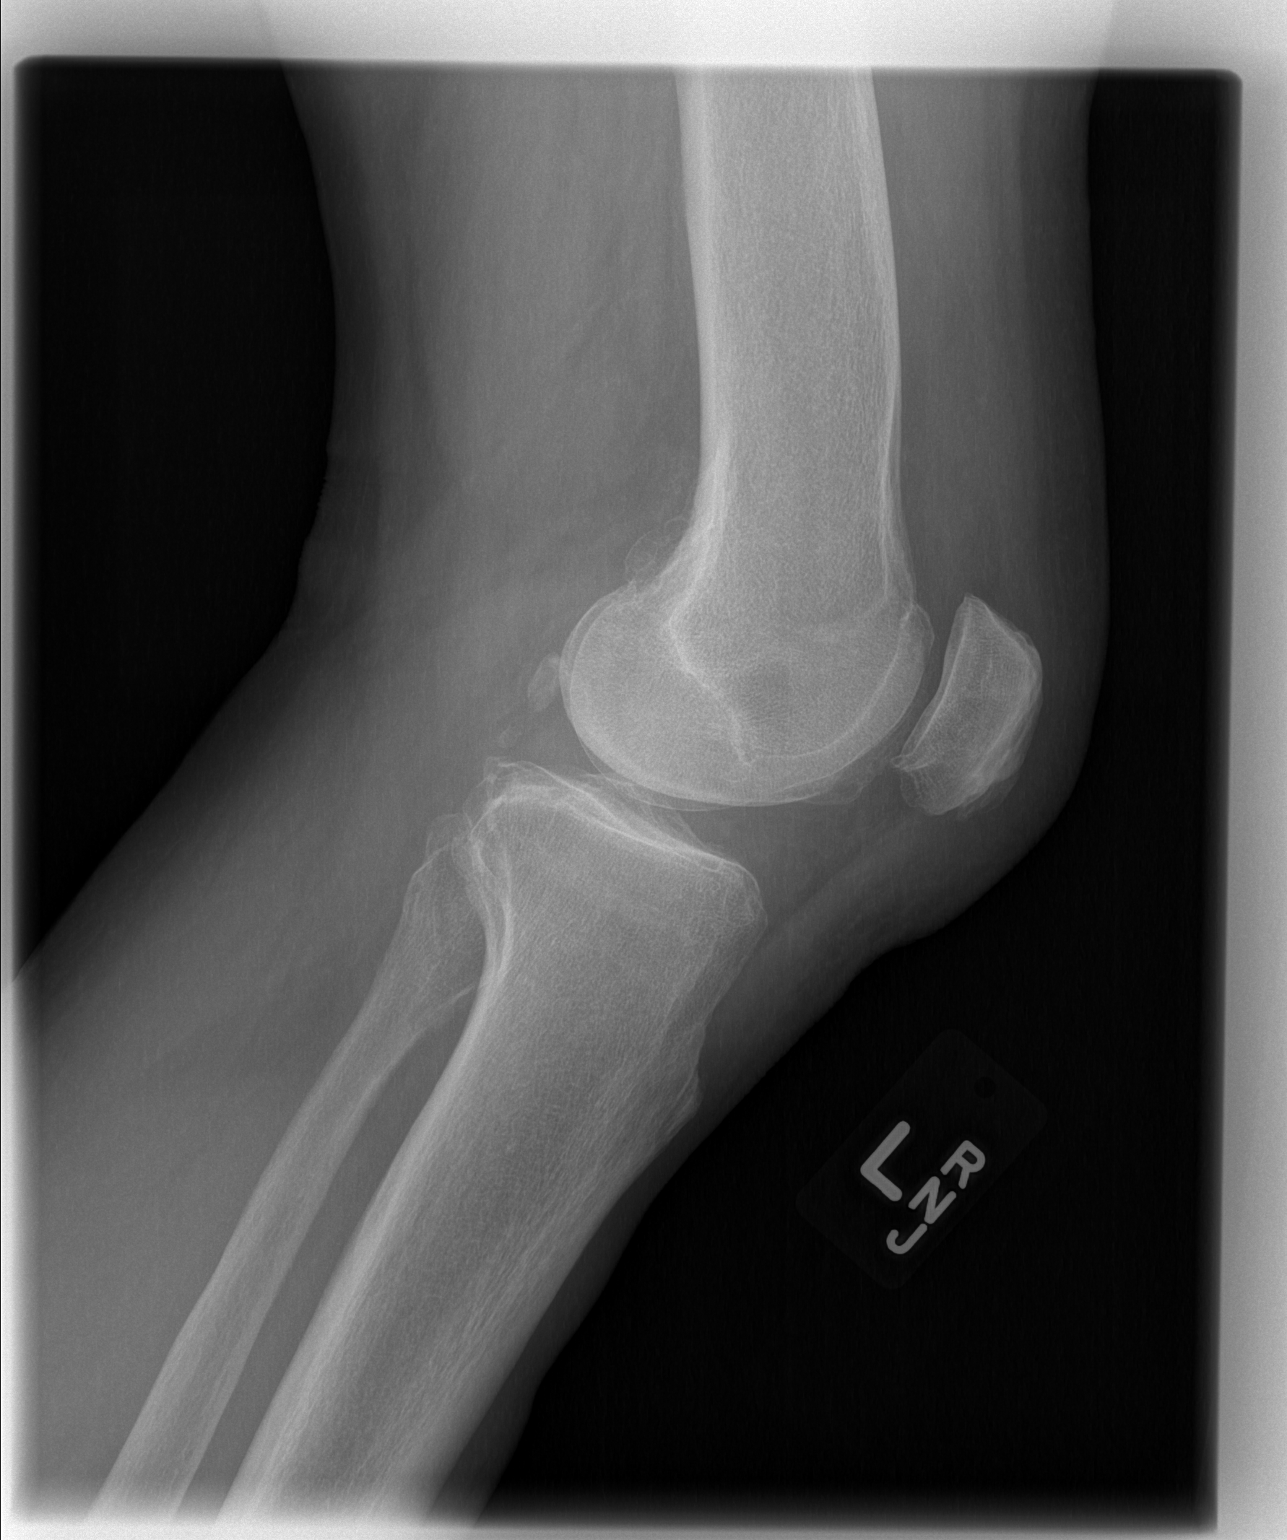

[2 of 2 positions shown; findings below may reference images not displayed]

FINDINGS: There is medial joint compartment narrowing with
subchondral sclerosis.  Mild chondrocalcinosis is suspected.  There
is osteophytosis.  No gross suprapatellar effusion.
IMPRESSION: Medial joint compartment narrowing with subchondral sclerosis.

Suspect chondrocalcinosis which suggests an element of CPPD.

## 2013-09-28 ENCOUNTER — Ambulatory Visit (INDEPENDENT_AMBULATORY_CARE_PROVIDER_SITE_OTHER): Payer: Medicare Other | Admitting: Cardiovascular Disease

## 2013-09-28 ENCOUNTER — Encounter: Payer: Self-pay | Admitting: Cardiovascular Disease

## 2013-09-28 VITALS — BP 150/80 | HR 57 | Ht 68.5 in | Wt 186.5 lb

## 2013-09-28 DIAGNOSIS — Z79899 Other long term (current) drug therapy: Secondary | ICD-10-CM

## 2013-09-28 DIAGNOSIS — I10 Essential (primary) hypertension: Secondary | ICD-10-CM

## 2013-09-28 DIAGNOSIS — R42 Dizziness and giddiness: Secondary | ICD-10-CM

## 2013-09-28 DIAGNOSIS — R5381 Other malaise: Secondary | ICD-10-CM

## 2013-09-28 DIAGNOSIS — R5383 Other fatigue: Secondary | ICD-10-CM

## 2013-09-28 DIAGNOSIS — I498 Other specified cardiac arrhythmias: Secondary | ICD-10-CM

## 2013-09-28 DIAGNOSIS — R001 Bradycardia, unspecified: Secondary | ICD-10-CM

## 2013-09-28 DIAGNOSIS — E782 Mixed hyperlipidemia: Secondary | ICD-10-CM

## 2013-09-28 DIAGNOSIS — E785 Hyperlipidemia, unspecified: Secondary | ICD-10-CM

## 2013-09-28 DIAGNOSIS — I2510 Atherosclerotic heart disease of native coronary artery without angina pectoris: Secondary | ICD-10-CM

## 2013-09-28 HISTORY — DX: Dizziness and giddiness: R42

## 2013-09-28 LAB — COMPREHENSIVE METABOLIC PANEL
ALBUMIN: 4.7 g/dL (ref 3.5–5.2)
ALT: 47 U/L (ref 0–53)
AST: 44 U/L — AB (ref 0–37)
Alkaline Phosphatase: 115 U/L (ref 39–117)
BUN: 15 mg/dL (ref 6–23)
CALCIUM: 9.5 mg/dL (ref 8.4–10.5)
CHLORIDE: 104 meq/L (ref 96–112)
CO2: 26 mEq/L (ref 19–32)
Creat: 0.97 mg/dL (ref 0.50–1.35)
GLUCOSE: 155 mg/dL — AB (ref 70–99)
POTASSIUM: 4.5 meq/L (ref 3.5–5.3)
Sodium: 140 mEq/L (ref 135–145)
TOTAL PROTEIN: 6.9 g/dL (ref 6.0–8.3)
Total Bilirubin: 0.7 mg/dL (ref 0.2–1.2)

## 2013-09-28 LAB — CBC
HEMATOCRIT: 40.2 % (ref 39.0–52.0)
HEMOGLOBIN: 13.9 g/dL (ref 13.0–17.0)
MCH: 31.2 pg (ref 26.0–34.0)
MCHC: 34.6 g/dL (ref 30.0–36.0)
MCV: 90.1 fL (ref 78.0–100.0)
Platelets: 159 10*3/uL (ref 150–400)
RBC: 4.46 MIL/uL (ref 4.22–5.81)
RDW: 13.9 % (ref 11.5–15.5)
WBC: 6 10*3/uL (ref 4.0–10.5)

## 2013-09-28 LAB — LIPID PANEL
CHOLESTEROL: 110 mg/dL (ref 0–200)
HDL: 40 mg/dL (ref >39)
LDL CALC: 40 mg/dL (ref 0–99)
TRIGLYCERIDES: 151 mg/dL — AB (ref <150)
Total CHOL/HDL Ratio: 2.8 Ratio
VLDL: 30 mg/dL (ref 0–40)

## 2013-09-28 LAB — TSH: TSH: 1.448 u[IU]/mL (ref 0.350–4.500)

## 2013-09-28 NOTE — Progress Notes (Signed)
Patient ID: [name] [name], male   DOB: 11-17-1920, 80 y.o.   MRN: [id number]      HPI: [name] [name], is a 80 y.o. male who presents to the office today for six-month cardiology evaluation.  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 He has established coronary artery disease. In May 2002 suffered an anterior wall myocardial infarction and required acute catheterization, thrombectomy, and underwent stenting of a proximal LAD occlusion with excellent reperfusion time of less than 2 hours. He had significant myocardial salvage and only has a small area of residual scarring in the anteroapical, septal, apical walls. His last nuclear perfusion study in December 2012 which was done prior to knee replacement surgery only showed a small region of distal LAD scar without ischemia.  [name] continues to feel well. He denies recurrent chest pain. He remains active. He denies shortness of breath. Shortly after Christmas, after getting up from sleeping and while walking to the bathroom he noticed an episode of dizziness and lightheadedness and he felt as if he had to somewhat stagger he gets up abruptly at, at times he's noticed a similar feeling. He was unaware of any rhythm abnormalities.  Past Medical History  Diagnosis Date  . Stroke     TIA 05/2011   . GERD (gastroesophageal reflux disease)   . Arthritis     arthritis in knees   . Cancer     hx of basal cell carcinoma   . Myocardial infarction     2002, stent implanted   . Hypertension 09/19/10    ECHO-EF53% The transmitral spectral doppler flow pattern suggestive of impairded LV relaxation--likely normal for age with no evidence of ^ LA pressures or LV filling pressures. IVC is dilated, measureing 2.2cm, but unable to assess respirophasic variation.The dilated IVC would suggest ^ CVP/RAP of ~ 10-15 mmHg. RVSP is estimated at 25-35 mmHg consistant with borderline elevated pressures.  .  Coronary artery disease 07/30/11    Nuclear stress test-post-test EF is 45% Lowrisk scan . when compard to previouc study there is no significant change.    Past Surgical History  Procedure Laterality Date  . Cardiac catheterization    . Coronary angioplasty      stent 2002   . Hernia repair      bilateral   . Total knee arthroplasty  09/12/2011    Procedure: TOTAL KNEE ARTHROPLASTY;  Surgeon: [name] Hai, MD;  Location: [facility];  Service: Orthopedics;  Laterality: Left;    Allergies  Allergen Reactions  . Celebrex [Celecoxib]   . Vicodin [Hydrocodone-Acetaminophen] Other (See Comments)    NIGHTMARES     Current Outpatient Prescriptions  Medication Sig Dispense Refill  . amLODipine (NORVASC) 5 MG tablet TAKE 1 TABLET DAILY  90 tablet  2  . aspirin EC 81 MG tablet Take 81 mg by mouth daily before breakfast.       . clopidogrel (PLAVIX) 75 MG tablet Take 1 tablet (75 mg total) by mouth daily.  30 tablet  1  . isosorbide mononitrate (IMDUR) 30 MG 24 hr tablet Take 1 tablet (30 mg total) by mouth daily before breakfast.  30 tablet  1  . Magnesium 500 MG CAPS Take 1 capsule by mouth daily.       . metoprolol succinate (TOPROL-XL) 25 MG 24 hr tablet Take 12.5 mg by mouth daily before breakfast.      . Multiple Vitamin (MULITIVITAMIN WITH MINERALS) TABS Take  1 tablet by mouth daily.       . naproxen sodium (ANAPROX) 220 MG tablet Take 440 mg by mouth 2 (two) times daily as needed. PAIN       . Omega-3 Fatty Acids (FISH OIL) 1200 MG CAPS Take 2 capsules by mouth daily.      . ramipril (ALTACE) 10 MG capsule TAKE 2 CAPSULES DAILY  180 capsule  2  . rosuvastatin (CRESTOR) 40 MG tablet Take 1 tablet (40 mg total) by mouth at bedtime.  30 tablet  0   No current facility-administered medications for this visit.    History   Social History  . Marital Status: Single    Spouse Name: N/A    Number of Children: N/A  . Years of Education: N/A   Occupational History  . Not on file.    Social History Main Topics  . Smoking status: Former Smoker    Quit date: 08/25/1950  . Smokeless tobacco: Never Used  . Alcohol Use: No  . Drug Use: No  . Sexual Activity: Not on file   Other Topics Concern  . Not on file   Social History Narrative  . No narrative on file    Family history is notable for both parents and all siblings are deceased.  ROS is negative for fevers, chills or night sweats. He denies change in vision or hearing. There is no rash. He denies awareness of lymphadenopathy. There is no cough increased sputum production. He denies wheezing. He denies chest pain. He denies PND orthopnea. There is no shortness of breath. He denies syncope. He denies abdominal pain. There is no nausea vomiting or diarrhea. He denies awareness of blood in stool or urine. He denies major musculoskeletal issues. His bleeding. He denies edema. He denies myalgias Other comprehensive 14 point system review is negative.  PE BP 150/80  Pulse 57  Ht 5' 8.5" (1.74 m)  Wt 186 lb 8 oz (84.596 kg)  BMI 27.94 kg/m2  General: Alert, oriented, no distress.  Skin: normal turgor, no rashes HEENT: Normocephalic, atraumatic. Pupils round and reactive; sclera anicteric;no lid lag.  Nose without nasal septal hypertrophy Mouth/Parynx benign; Mallinpatti scale 2 Neck: No JVD, no carotid bruits with normal carotid upstroke Lungs: clear to ausculatation and percussion; no wheezing or rales Chest wall: No tenderness to palpation Heart: RRR, s1 s2 normal 1/6 systolic murmur, no S3 or S4 gallop. No heave the Abdomen: Mild diastases recti;soft, nontender; no hepatosplenomehaly, BS+; abdominal aorta nontender and not dilated by palpation. Back: No CVA Pulses 2+ Extremities: no clubbing cyanosis or edema, Homan's sign negative  Neurologic: grossly nonfocal; cranial nerves intact Psychological: Normal affect and mood appear  ECG (independently read by me): Sinus bradycardia 57 beats per minute. Isolated  PVC.  Prior ECG of August 2014 : Sinus bradycardia at 48 beats per minute with mild sinus arrhythmia. Poor anterior R-wave progression compatible with his old anterior wall MI.  LABS:  BMET    Component Value Date/Time   NA 129* 09/15/2011 0454   K 3.9 09/15/2011 0454   CL 93* 09/15/2011 0454   CO2 26 09/15/2011 0454   GLUCOSE 157* 09/15/2011 0454   BUN 14 09/15/2011 0454   CREATININE 0.91 09/15/2011 0454   CALCIUM 8.6 09/15/2011 0454   GFRNONAA 73* 09/15/2011 0454   GFRAA 85* 09/15/2011 0454     Hepatic Function Panel     Component Value Date/Time   PROT 7.6 09/04/2011 1025   ALBUMIN 4.4 09/04/2011 1025  AST 34 09/04/2011 1025   ALT 40 09/04/2011 1025   ALKPHOS 115 09/04/2011 1025   BILITOT 0.6 09/04/2011 1025     CBC    Component Value Date/Time   WBC 8.3 09/15/2011 0454   RBC 3.40* 09/15/2011 0454   HGB 10.7* 09/15/2011 0454   HCT 29.6* 09/15/2011 0454   PLT 108* 09/15/2011 0454   MCV 87.1 09/15/2011 0454   MCH 31.5 09/15/2011 0454   MCHC 36.1* 09/15/2011 0454   RDW 13.0 09/15/2011 0454   LYMPHSABS 2.0 09/04/2011 1025   MONOABS 0.7 09/04/2011 1025   EOSABS 0.2 09/04/2011 1025   BASOSABS 0.0 09/04/2011 1025     BNP No results found for this basename: probnp    Lipid Panel  No results found for this basename: chol,  trig,  hdl,  cholhdl,  vldl,  ldlcalc     RADIOLOGY: No results found.    ASSESSMENT AND PLAN: Mr [name] [name] is now 33 years following his anterior wall myocardial infarction with successful acute intervention in May 2002. He has  documentation of significant myocardial salvage. He does have only a small area of distal LAD scar. Clinically he remains active and is doing well without significant symptoms. His pulse at his last office visit was low at 48 and at that time I reduced his Toprol dose from 25 mg to 12.5 mg daily. Presently, he is without chest pain or shortness of breath. He denies change in activity. He did have complete set of laboratories done in  February one year ago. He does have a history of hyperlipidemia and has been on Crestor 40 mg with good results. At that time, his LDL cholesterol was 53 with total cholesterol 129. He recently had an episode of dizziness and his resting pulse now is 57 although he did have an isolated PVC noted on today's ECG. Presently, and he is not orthostatic on blood pressure evaluation by me with a blood pressure 124/80 supine and is actually increased to 132/80 standing. He has been taking ramipril 20 mg. I am recommending he reduced this to 10 mg daily. He is fasting today. I will obtain a complete set of laboratory. As long as he remains stable, I will see him in one year followup evaluation    Troy Sine, MD, [facility]  09/28/2013 8:23 AM

## 2013-09-28 NOTE — Patient Instructions (Signed)
Your physician recommends that you schedule a follow-up appointment in: 6 Months  Your physician recommends that you return for lab work Today CBC, CMP, TSH, FASTING LIPIDS  Your physician has recommended you make the following change in your medication: Decrease Ramipril to 10 mg daily

## 2013-10-14 ENCOUNTER — Other Ambulatory Visit: Payer: Self-pay | Admitting: Cardiovascular Disease

## 2013-10-14 NOTE — Telephone Encounter (Signed)
Rx was sent to pharmacy electronically. 

## 2013-10-19 ENCOUNTER — Other Ambulatory Visit: Payer: Self-pay | Admitting: *Deleted

## 2013-10-19 DIAGNOSIS — R7989 Other specified abnormal findings of blood chemistry: Secondary | ICD-10-CM

## 2013-12-05 LAB — HEPATIC FUNCTION PANEL
ALBUMIN: 4.5 g/dL (ref 3.5–5.2)
ALT: 47 U/L (ref 0–53)
AST: 47 U/L — ABNORMAL HIGH (ref 0–37)
Alkaline Phosphatase: 111 U/L (ref 39–117)
Bilirubin, Direct: 0.1 mg/dL (ref 0.0–0.3)
Indirect Bilirubin: 0.4 mg/dL (ref 0.2–1.2)
Total Bilirubin: 0.5 mg/dL (ref 0.2–1.2)
Total Protein: 6.9 g/dL (ref 6.0–8.3)

## 2013-12-06 ENCOUNTER — Encounter: Payer: Self-pay | Admitting: *Deleted

## 2013-12-12 ENCOUNTER — Other Ambulatory Visit: Payer: Self-pay | Admitting: Cardiovascular Disease

## 2013-12-12 NOTE — Telephone Encounter (Signed)
Rx was sent to pharmacy electronically. 

## 2013-12-29 ENCOUNTER — Other Ambulatory Visit: Payer: Self-pay | Admitting: Cardiovascular Disease

## 2013-12-29 NOTE — Telephone Encounter (Signed)
Rx was sent to pharmacy electronically. 

## 2014-03-12 ENCOUNTER — Other Ambulatory Visit: Payer: Self-pay | Admitting: Cardiovascular Disease

## 2014-03-13 NOTE — Telephone Encounter (Signed)
Rx refill sent to patient pharmacy   

## 2014-03-24 ENCOUNTER — Other Ambulatory Visit: Payer: Self-pay | Admitting: Cardiovascular Disease

## 2014-03-24 NOTE — Telephone Encounter (Signed)
Rx was sent to pharmacy electronically. 

## 2014-08-03 ENCOUNTER — Other Ambulatory Visit: Payer: Self-pay | Admitting: Cardiovascular Disease

## 2014-08-03 NOTE — Telephone Encounter (Signed)
Rx was sent to pharmacy electronically. 

## 2014-09-20 ENCOUNTER — Other Ambulatory Visit: Payer: Self-pay | Admitting: Cardiovascular Disease

## 2014-09-20 NOTE — Telephone Encounter (Signed)
Rx(s) sent to pharmacy electronically.  

## 2014-09-25 ENCOUNTER — Other Ambulatory Visit: Payer: Self-pay | Admitting: Cardiovascular Disease

## 2014-09-25 NOTE — Telephone Encounter (Signed)
Rx refill sent to patient pharmacy   

## 2014-10-13 ENCOUNTER — Other Ambulatory Visit: Payer: Self-pay | Admitting: Cardiovascular Disease

## 2014-10-13 NOTE — Telephone Encounter (Signed)
Rx(s) sent to pharmacy electronically.  

## 2014-10-26 ENCOUNTER — Other Ambulatory Visit: Payer: Self-pay | Admitting: Cardiovascular Disease

## 2014-10-26 NOTE — Telephone Encounter (Signed)
°  1. Which medications need to be refilled? Crestor(new prescription needed)  2. Which pharmacy is medication to be sent to?[facility]  3. Do they need a 30 day or 90 day supply? 90  4. Would they like a call back once the medication has been sent to the pharmacy? yes

## 2014-10-27 ENCOUNTER — Telehealth: Payer: Self-pay | Admitting: Cardiovascular Disease

## 2014-10-27 DIAGNOSIS — E782 Mixed hyperlipidemia: Secondary | ICD-10-CM

## 2014-10-27 DIAGNOSIS — Z79899 Other long term (current) drug therapy: Secondary | ICD-10-CM

## 2014-10-27 DIAGNOSIS — I2510 Atherosclerotic heart disease of native coronary artery without angina pectoris: Secondary | ICD-10-CM

## 2014-10-27 NOTE — Telephone Encounter (Signed)
Pt called in stating that he will need some lab orders put in because Dr. Claiborne Billings is going to want to check his blood. Please f/u with pt  Thanks

## 2014-10-27 NOTE — Telephone Encounter (Addendum)
Left message on machine to call back.  Lab orders put in for [facility].

## 2014-10-27 NOTE — Telephone Encounter (Signed)
LM that lab orders are ready.

## 2014-10-28 ENCOUNTER — Other Ambulatory Visit: Payer: Self-pay | Admitting: Cardiovascular Disease

## 2014-10-30 MED ORDER — ROSUVASTATIN CALCIUM 40 MG PO TABS: 40.0000 mg | ORAL_TABLET | Freq: Every day | ORAL | Status: DC

## 2014-10-30 NOTE — Telephone Encounter (Signed)
Close encounter 

## 2014-10-30 NOTE — Telephone Encounter (Signed)
Rx(s) sent to pharmacy electronically. Patient has OV 11/24/14 LM that medication was refilled.

## 2014-10-30 NOTE — Telephone Encounter (Signed)
Can this encounter be closed?

## 2014-10-30 NOTE — Telephone Encounter (Signed)
Rx(s) sent to pharmacy electronically.  

## 2014-11-24 ENCOUNTER — Ambulatory Visit (INDEPENDENT_AMBULATORY_CARE_PROVIDER_SITE_OTHER): Payer: Medicare Other | Admitting: Cardiovascular Disease

## 2014-11-24 VITALS — BP 138/72 | HR 58 | Ht 68.0 in | Wt 177.4 lb

## 2014-11-24 DIAGNOSIS — E785 Hyperlipidemia, unspecified: Secondary | ICD-10-CM

## 2014-11-24 DIAGNOSIS — I10 Essential (primary) hypertension: Secondary | ICD-10-CM

## 2014-11-24 DIAGNOSIS — I2510 Atherosclerotic heart disease of native coronary artery without angina pectoris: Secondary | ICD-10-CM

## 2014-11-24 DIAGNOSIS — R001 Bradycardia, unspecified: Secondary | ICD-10-CM

## 2014-11-24 DIAGNOSIS — Z79899 Other long term (current) drug therapy: Secondary | ICD-10-CM | POA: Diagnosis not present

## 2014-11-24 DIAGNOSIS — I2583 Coronary atherosclerosis due to lipid rich plaque: Secondary | ICD-10-CM

## 2014-11-24 LAB — LIPID PANEL
CHOL/HDL RATIO: 2.9 ratio
Cholesterol: 115 mg/dL (ref 0–200)
HDL: 39 mg/dL — AB (ref >=40)
LDL Cholesterol: 41 mg/dL (ref 0–99)
Triglycerides: 174 mg/dL — ABNORMAL HIGH (ref <150)
VLDL: 35 mg/dL (ref 0–40)

## 2014-11-24 LAB — CBC
HCT: 43.1 % (ref 39.0–52.0)
Hemoglobin: 14.6 g/dL (ref 13.0–17.0)
MCH: 30.3 pg (ref 26.0–34.0)
MCHC: 33.9 g/dL (ref 30.0–36.0)
MCV: 89.4 fL (ref 78.0–100.0)
MPV: 9.6 fL (ref 8.6–12.4)
Platelets: 154 10*3/uL (ref 150–400)
RBC: 4.82 MIL/uL (ref 4.22–5.81)
RDW: 13.9 % (ref 11.5–15.5)
WBC: 5.8 10*3/uL (ref 4.0–10.5)

## 2014-11-24 LAB — COMPREHENSIVE METABOLIC PANEL
ALK PHOS: 115 U/L (ref 39–117)
ALT: 29 U/L (ref 0–53)
AST: 35 U/L (ref 0–37)
Albumin: 4.8 g/dL (ref 3.5–5.2)
BUN: 18 mg/dL (ref 6–23)
CO2: 25 mEq/L (ref 19–32)
Calcium: 9.6 mg/dL (ref 8.4–10.5)
Chloride: 103 mEq/L (ref 96–112)
Creat: 0.89 mg/dL (ref 0.50–1.35)
GLUCOSE: 140 mg/dL — AB (ref 70–99)
Potassium: 5.2 mEq/L (ref 3.5–5.3)
Sodium: 139 mEq/L (ref 135–145)
Total Bilirubin: 0.7 mg/dL (ref 0.2–1.2)
Total Protein: 7.6 g/dL (ref 6.0–8.3)

## 2014-11-24 NOTE — Patient Instructions (Signed)
Your physician recommends that you return for lab work fasting.  Your physician wants you to follow-up in: 1 year or sooner if needed with Dr. Claiborne Billings. You will receive a reminder letter in the mail two months in advance. If you don't receive a letter, please call our office to schedule the follow-up appointment.

## 2014-11-25 ENCOUNTER — Encounter: Payer: Self-pay | Admitting: Cardiovascular Disease

## 2014-11-25 LAB — TSH: TSH: 0.752 u[IU]/mL (ref 0.350–4.500)

## 2014-11-25 NOTE — Progress Notes (Signed)
Patient ID: [name] [name], male   DOB: Jan 15, 1921, 81 y.o.   MRN: [id number]      HPI: [name] [name] is a 81 y.o. male who presents to the office today for a 72 month cardiology evaluation.  [name] has established CAD and in May 2002 suffered an anterior wall myocardial infarction. He underwent  acute catheterization, thrombectomy, and stenting of a proximal LAD occlusion with excellent reperfusion time of less than 2 hours. He had significant myocardial salvage and only has a small area of residual scarring in the anteroapical, septal, apical walls. His last nuclear perfusion study in December 2012 which was done prior to knee replacement surgery only showed a small region of distal LAD scar without ischemia.  [name] continues to feel well. He denies recurrent chest pain. He remains active working in housing maintenance.  He denies shortness of breath.  He denies recent chest pain or shortness of breath.  He denies any recent episodes of dizziness, although he had experienced an episode over a year ago.  He denies palpitations.  There is no presyncope or syncope.  He has been on Crestor 40 mg for hyperlipidemia.  He has been on REM a little 10 mg, Toprol-XL 12.5 mg amlodipine 5 mg and isosorbide mononitrate 30 mg for his CAD and hypertension.  He denies bleeding with continue dual antiplatelet therapy with aspirin and Plavix.  Past Medical History  Diagnosis Date  . Stroke     TIA 05/2011   . GERD (gastroesophageal reflux disease)   . Arthritis     arthritis in knees   . Cancer     hx of basal cell carcinoma   . Myocardial infarction     2002, stent implanted   . Hypertension 09/19/10    ECHO-EF53% The transmitral spectral doppler flow pattern suggestive of impairded LV relaxation--likely normal for age with no evidence of ^ LA pressures or LV filling pressures. IVC is dilated, measureing 2.2cm, but unable to assess respirophasic variation.The dilated IVC would suggest ^  CVP/RAP of ~ 10-15 mmHg. RVSP is estimated at 25-35 mmHg consistant with borderline elevated pressures.  . Coronary artery disease 07/30/11    Nuclear stress test-post-test EF is 45% Lowrisk scan . when compard to previouc study there is no significant change.    Past Surgical History  Procedure Laterality Date  . Cardiac catheterization    . Coronary angioplasty      stent 2002   . Hernia repair      bilateral   . Total knee arthroplasty  09/12/2011    Procedure: TOTAL KNEE ARTHROPLASTY;  Surgeon: Johnn Hai, MD;  Location: [facility];  Service: Orthopedics;  Laterality: Left;    Allergies  Allergen Reactions  . Celebrex [Celecoxib]   . Vicodin [Hydrocodone-Acetaminophen] Other (See Comments)    NIGHTMARES     Current Outpatient Prescriptions  Medication Sig Dispense Refill  . amLODipine (NORVASC) 5 MG tablet Take 1 tablet (5 mg total) by mouth daily. MUST KEEP APPOINTMENT 11/24/2014 WITH DR Claiborne Billings FOR FUTURE REFILLS 90 tablet 0  . aspirin EC 81 MG tablet Take 81 mg by mouth daily before breakfast.     . clopidogrel (PLAVIX) 75 MG tablet Take 1 tablet (75 mg total) by mouth daily. <please make appointment for refills> 90 tablet 0  . isosorbide mononitrate (IMDUR) 30 MG 24 hr tablet TAKE 1 TABLET DAILY BEFORE BREAKFAST 90 tablet 2  . Magnesium 500 MG CAPS Take 1 capsule by mouth daily.     Marland Kitchen  metoprolol succinate (TOPROL-XL) 25 MG 24 hr tablet Take 0.5 tablets (12.5 mg total) by mouth daily. *MUST MAKE APPOINTMENT* 15 tablet 0  . Multiple Vitamin (MULITIVITAMIN WITH MINERALS) TABS Take 1 tablet by mouth daily.     . naproxen sodium (ANAPROX) 220 MG tablet Take 440 mg by mouth 2 (two) times daily as needed. PAIN     . Omega-3 Fatty Acids (FISH OIL) 1200 MG CAPS Take 2 capsules by mouth daily.    . ramipril (ALTACE) 10 MG capsule Take 1 capsule (10 mg total) by mouth daily. MUST KEEP APPOINTMENT 11/24/2014 WITH DR Claiborne Billings FOR FUTURE REFILLS 90 capsule 0  . rosuvastatin (CRESTOR) 40 MG  tablet Take 1 tablet (40 mg total) by mouth daily. 30 tablet 0   No current facility-administered medications for this visit.    History   Social History  . Marital Status: Single    Spouse Name: N/A  . Number of Children: N/A  . Years of Education: N/A   Occupational History  . Not on file.   Social History Main Topics  . Smoking status: Former Smoker    Quit date: 08/25/1950  . Smokeless tobacco: Never Used  . Alcohol Use: No  . Drug Use: No  . Sexual Activity: Not on file   Other Topics Concern  . Not on file   Social History Narrative    Family history is notable for both parents and all siblings are deceased.   ROS General: Negative; No fevers, chills, or night sweats;  HEENT: Negative; No changes in vision or hearing, sinus congestion, difficulty swallowing Pulmonary: Negative; No cough, wheezing, shortness of breath, hemoptysis Cardiovascular: Negative; No chest pain, presyncope, syncope, palpitations GI: Negative; No nausea, vomiting, diarrhea, or abdominal pain GU: Negative; No dysuria, hematuria, or difficulty voiding Musculoskeletal: Negative; no myalgias, joint pain, or weakness Hematologic/Oncology: Negative; no easy bruising, bleeding Endocrine: Negative; no heat/cold intolerance; no diabetes Neuro: Negative; no changes in balance, headaches Skin: Negative; No rashes or skin lesions Psychiatric: Negative; No behavioral problems, depression Sleep: Negative; No snoring, daytime sleepiness, hypersomnolence, bruxism, restless legs, hypnogognic hallucinations, no cataplexy Other comprehensive 14 point system review is negative.  PE BP 138/72 mmHg  Pulse 58  Ht '5\' 8"'  (1.727 m)  Wt 177 lb 6.4 oz (80.468 kg)  BMI 26.98 kg/m2  General: Alert, oriented, no distress.  He appears younger than his stated age of 45 Skin: normal turgor, no rashes HEENT: Normocephalic, atraumatic. Pupils round and reactive; sclera anicteric;no lid lag.  Nose without nasal  septal hypertrophy Mouth/Parynx benign; Mallinpatti scale 2 Neck: No JVD, no carotid bruits with normal carotid upstroke Lungs: clear to ausculatation and percussion; no wheezing or rales Chest wall: No tenderness to palpation Heart: RRR, s1 s2 normal 1/6 systolic murmur, no S3 or S4 gallop.  No diastolic murmur.  No rubs thrills or heaves. Abdomen: Mild diastases recti;soft, nontender; no hepatosplenomehaly, BS+; abdominal aorta nontender and not dilated by palpation. Back: No CVA Pulses 2+ Extremities: no clubbing cyanosis or edema, Homan's sign negative  Neurologic: grossly nonfocal; cranial nerves intact Psychological: Normal affect and mood.  ECG (independently read by me): Sinus bradycardia 58 bpm.  Left axis deviation.  Poor progression V1 through V4.  No significant ST-T changes.  ECG (independently read by me): Sinus bradycardia 57 beats per minute. Isolated PVC.  Prior ECG of August 2014 : Sinus bradycardia at 48 beats per minute with mild sinus arrhythmia. Poor anterior R-wave progression compatible with his old anterior wall MI.  LABS:  BMET  BMP Latest Ref Rng 11/24/2014 09/28/2013 09/15/2011  Glucose 70 - 99 mg/dL 140(H) 155(H) 157(H)  BUN 6 - 23 mg/dL '18 15 14  ' Creatinine 0.50 - 1.35 mg/dL 0.89 0.97 0.91  Sodium 135 - 145 mEq/L 139 140 129(L)  Potassium 3.5 - 5.3 mEq/L 5.2 4.5 3.9  Chloride 96 - 112 mEq/L 103 104 93(L)  CO2 19 - 32 mEq/L '25 26 26  ' Calcium 8.4 - 10.5 mg/dL 9.6 9.5 8.6     Hepatic Function Panel    Hepatic Function Latest Ref Rng 11/24/2014 12/05/2013 09/28/2013  Total Protein 6.0 - 8.3 g/dL 7.6 6.9 6.9  Albumin 3.5 - 5.2 g/dL 4.8 4.5 4.7  AST 0 - 37 U/L 35 47(H) 44(H)  ALT 0 - 53 U/L 29 47 47  Alk Phosphatase 39 - 117 U/L 115 111 115  Total Bilirubin 0.2 - 1.2 mg/dL 0.7 0.5 0.7  Bilirubin, Direct 0.0 - 0.3 mg/dL - 0.1 -    CBC  CBC Latest Ref Rng 11/24/2014 09/28/2013 09/15/2011  WBC 4.0 - 10.5 K/uL 5.8 6.0 8.3  Hemoglobin 13.0 - 17.0 g/dL 14.6 13.9  10.7(L)  Hematocrit 39.0 - 52.0 % 43.1 40.2 29.6(L)  Platelets 150 - 400 K/uL 154 159 108(L)     BNP No results found for: PROBNP    Lipid Panel     Component Value Date/Time   CHOL 115 11/24/2014 1051   TRIG 174* 11/24/2014 1051   HDL 39* 11/24/2014 1051   CHOLHDL 2.9 11/24/2014 1051   VLDL 35 11/24/2014 1051   LDLCALC 41 11/24/2014 1051      RADIOLOGY: No results found.    ASSESSMENT AND PLAN: Mr Rochel Brome is a very young appearing 81 year old active gentleman who is 14 years following his anterior wall myocardial infarction with successful acute intervention in May 2002. He has  documentation of significant myocardial salvage. He does have only a small area of distal LAD scar. Clinically he remains active and is doing well without significant symptoms.  Remotely had developed significant bradycardia leading to dose reduction of his Toprol to 12.5 mg.  His resting pulse today is 58, which is stable.  His blood pressure continues to be controlled on his current medical regimen consisting of amlodipine, Toprol, isosorbide, mononitrate, and ramipril 10 mg.  He denies any symptoms suggestive of CHF.  Physical exam essentially is unchanged from one year previously.  He continues to be aggressively treated with lipid therapy with target LDL at 41.  He has mild glucose elevation fasting.  He will continue his current therapy as prescribed.  As long as he remains stable I'll see him in one year for reevaluation.  Time spent: 25 minutes   Troy Sine, MD, [facility]  11/25/2014 3:43 PM

## 2014-11-30 ENCOUNTER — Other Ambulatory Visit: Payer: Self-pay | Admitting: Cardiovascular Disease

## 2014-11-30 NOTE — Telephone Encounter (Signed)
Rx(s) sent to pharmacy electronically.  

## 2014-12-04 ENCOUNTER — Other Ambulatory Visit: Payer: Self-pay | Admitting: Cardiovascular Disease

## 2014-12-20 ENCOUNTER — Other Ambulatory Visit: Payer: Self-pay

## 2014-12-20 MED ORDER — ROSUVASTATIN CALCIUM 40 MG PO TABS: 40.0000 mg | ORAL_TABLET | Freq: Every day | ORAL | Status: DC

## 2014-12-20 NOTE — Telephone Encounter (Signed)
Rx(s) sent to pharmacy electronically.  

## 2014-12-28 ENCOUNTER — Telehealth: Payer: Self-pay | Admitting: Cardiovascular Disease

## 2014-12-28 MED ORDER — ROSUVASTATIN CALCIUM 40 MG PO TABS: 40.0000 mg | ORAL_TABLET | Freq: Every day | ORAL | Status: DC

## 2014-12-28 NOTE — Telephone Encounter (Signed)
Refill submitted to patient's preferred pharmacy. Informed patient. He requested refill be sent to [facility] as well - this was done. Pt voiced understanding, no other stated concerns at this time.

## 2014-12-28 NOTE — Telephone Encounter (Signed)
°  1. Which medications need to be refilled? Crestor-pt needs this,his mail order have not come in 2. Which pharmacy is medication to be sent to?[facility]  3. Do they need a 30 day or 90 day supply? 30 4. Would they like a call back once the medication has been sent to the pharmacy? yes

## 2015-01-08 ENCOUNTER — Encounter: Payer: Self-pay | Admitting: *Deleted

## 2015-01-26 ENCOUNTER — Other Ambulatory Visit: Payer: Self-pay | Admitting: Cardiovascular Disease

## 2015-01-26 NOTE — Telephone Encounter (Signed)
Rx(s) sent to pharmacy electronically.  

## 2015-03-20 ENCOUNTER — Other Ambulatory Visit: Payer: Self-pay | Admitting: Cardiovascular Disease

## 2015-03-20 MED ORDER — METOPROLOL SUCCINATE ER 25 MG PO TB24: 12.5000 mg | ORAL_TABLET | Freq: Every day | ORAL | Status: DC

## 2015-03-20 NOTE — Telephone Encounter (Signed)
Rx(s) sent to pharmacy electronically.  

## 2015-03-20 NOTE — Telephone Encounter (Signed)
°  1. Which medications need to be refilled? Metoprolol 2. Which pharmacy is medication to be sent to? [facility]  3. Do they need a 30 day or 90 day supply? 90 and refill  4. Would they like a call back once the medication has been sent to the pharmacy? no

## 2015-10-24 ENCOUNTER — Other Ambulatory Visit: Payer: Self-pay | Admitting: Cardiovascular Disease

## 2015-10-24 NOTE — Telephone Encounter (Signed)
REFILL 

## 2015-11-29 ENCOUNTER — Other Ambulatory Visit: Payer: Self-pay | Admitting: Cardiovascular Disease

## 2015-11-29 NOTE — Telephone Encounter (Signed)
Rx request sent to pharmacy.  

## 2016-01-21 ENCOUNTER — Other Ambulatory Visit: Payer: Self-pay | Admitting: Cardiovascular Disease

## 2016-01-22 ENCOUNTER — Ambulatory Visit (INDEPENDENT_AMBULATORY_CARE_PROVIDER_SITE_OTHER): Payer: Medicare Other | Admitting: Cardiovascular Disease

## 2016-01-22 ENCOUNTER — Encounter: Payer: Self-pay | Admitting: Cardiovascular Disease

## 2016-01-22 VITALS — BP 110/62 | HR 56 | Ht 66.0 in | Wt 179.0 lb

## 2016-01-22 DIAGNOSIS — R001 Bradycardia, unspecified: Secondary | ICD-10-CM | POA: Diagnosis not present

## 2016-01-22 DIAGNOSIS — I2510 Atherosclerotic heart disease of native coronary artery without angina pectoris: Secondary | ICD-10-CM | POA: Diagnosis not present

## 2016-01-22 DIAGNOSIS — I440 Atrioventricular block, first degree: Secondary | ICD-10-CM

## 2016-01-22 DIAGNOSIS — I4510 Unspecified right bundle-branch block: Secondary | ICD-10-CM

## 2016-01-22 DIAGNOSIS — I10 Essential (primary) hypertension: Secondary | ICD-10-CM | POA: Diagnosis not present

## 2016-01-22 DIAGNOSIS — E785 Hyperlipidemia, unspecified: Secondary | ICD-10-CM | POA: Diagnosis not present

## 2016-01-22 DIAGNOSIS — I2583 Coronary atherosclerosis due to lipid rich plaque: Secondary | ICD-10-CM

## 2016-01-22 LAB — LIPID PANEL
Cholesterol: 108 mg/dL — ABNORMAL LOW (ref 125–200)
HDL: 37 mg/dL — ABNORMAL LOW (ref >=40)
LDL CALC: 35 mg/dL (ref <130)
TRIGLYCERIDES: 180 mg/dL — AB (ref <150)
Total CHOL/HDL Ratio: 2.9 Ratio (ref <=5.0)
VLDL: 36 mg/dL — ABNORMAL HIGH (ref <30)

## 2016-01-22 LAB — TSH: TSH: 0.88 m[IU]/L (ref 0.40–4.50)

## 2016-01-22 LAB — CBC
HCT: 40.6 % (ref 38.5–50.0)
HEMOGLOBIN: 13.7 g/dL (ref 13.2–17.1)
MCH: 30.6 pg (ref 27.0–33.0)
MCHC: 33.7 g/dL (ref 32.0–36.0)
MCV: 90.8 fL (ref 80.0–100.0)
MPV: 9.9 fL (ref 7.5–12.5)
Platelets: 151 10*3/uL (ref 140–400)
RBC: 4.47 MIL/uL (ref 4.20–5.80)
RDW: 13.9 % (ref 11.0–15.0)
WBC: 5.8 10*3/uL (ref 3.8–10.8)

## 2016-01-22 LAB — COMPREHENSIVE METABOLIC PANEL
ALBUMIN: 4.4 g/dL (ref 3.6–5.1)
ALT: 43 U/L (ref 9–46)
AST: 48 U/L — AB (ref 10–35)
Alkaline Phosphatase: 122 U/L — ABNORMAL HIGH (ref 40–115)
BILIRUBIN TOTAL: 0.8 mg/dL (ref 0.2–1.2)
BUN: 21 mg/dL (ref 7–25)
CO2: 24 mmol/L (ref 20–31)
CREATININE: 1.02 mg/dL (ref 0.70–1.11)
Calcium: 9.2 mg/dL (ref 8.6–10.3)
Chloride: 102 mmol/L (ref 98–110)
GLUCOSE: 193 mg/dL — AB (ref 65–99)
Potassium: 4.7 mmol/L (ref 3.5–5.3)
SODIUM: 137 mmol/L (ref 135–146)
Total Protein: 6.7 g/dL (ref 6.1–8.1)

## 2016-01-22 NOTE — Patient Instructions (Signed)
Your physician recommends that you return for lab work.  Your physician wants you to follow-up in: 1 year or sooner if needed.You will receive a reminder letter in the mail two months in advance. If you don't receive a letter, please call our office to schedule the follow-up appointment.   If you need a refill on your cardiac medications before your next appointment, please call your pharmacy.

## 2016-01-23 ENCOUNTER — Encounter: Payer: Self-pay | Admitting: Cardiovascular Disease

## 2016-01-23 DIAGNOSIS — I440 Atrioventricular block, first degree: Secondary | ICD-10-CM | POA: Insufficient documentation

## 2016-01-23 DIAGNOSIS — I4510 Unspecified right bundle-branch block: Secondary | ICD-10-CM

## 2016-01-23 HISTORY — DX: Unspecified right bundle-branch block: I45.10

## 2016-01-23 HISTORY — DX: Atrioventricular block, first degree: I44.0

## 2016-01-23 NOTE — Progress Notes (Signed)
Patient ID: [name] [name], male   DOB: 1921-05-27, 82 y.o.   MRN: [id number]    Primary: Silvio Pate, PA-C  HPI: [name] [name] is a 82 y.o. male who presents to the office today for a 17 month cardiology evaluation.  [name] [name] has CAD and in May 2002 suffered an anterior wall myocardial infarction. He underwent  acute catheterization, thrombectomy, and stenting of a proximal LAD occlusion with excellent reperfusion time of less than 2 hours. He had significant myocardial salvage and only has a small area of residual scarring in the anteroapical, septal, apical walls. His last nuclear perfusion study in December 2012 which was done prior to knee replacement surgery only showed a small region of distal LAD scar without ischemia.  [name] [name] continues to feel well.  He denies recurrent chest pain. 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  He denies shortness of breath.  He denies recent chest pain or shortness of breath.  He denies any recent episodes of dizziness.  He has not had recent blood work.  He has been taking Crestor 40 mg and omega-3 fatty acids for hyperlipidemia.  He has been taking Toprol XL 12.5 mg daily and isosorbide mononitrate 30 mg in addition to amlodipine 5 mg, both for blood pressure and CAD.  He continues to be on dual antiplatelet therapy with aspirin and Plavix and denies bleeding.  He presents for one-year evaluation.  Past Medical History  Diagnosis Date  . Stroke [facility])     TIA 05/2011   . GERD (gastroesophageal reflux disease)   . Arthritis     arthritis in knees   . Cancer (HCC)     hx of basal cell carcinoma   . Myocardial infarction ([place])     2002, stent implanted   . Hypertension 09/19/10    ECHO-EF53% The transmitral spectral doppler flow pattern suggestive of impairded LV relaxation--likely normal for age with no evidence of ^ LA pressures or LV filling pressures.  IVC is dilated, measureing 2.2cm, but unable to assess respirophasic variation.The dilated IVC would suggest ^ CVP/RAP of ~ 10-15 mmHg. RVSP is estimated at 25-35 mmHg consistant with borderline elevated pressures.  . Coronary artery disease 07/30/11    Nuclear stress test-post-test EF is 45% Lowrisk scan . when compard to previouc study there is no significant change.    Past Surgical History  Procedure Laterality Date  . Cardiac catheterization    . Coronary angioplasty      stent 2002   . Hernia repair      bilateral   . Total knee arthroplasty  09/12/2011    Procedure: TOTAL KNEE ARTHROPLASTY;  Surgeon: Johnn Hai, MD;  Location: [facility];  Service: Orthopedics;  Laterality: Left;    Allergies  Allergen Reactions  . Celebrex [Celecoxib]   . Vicodin [Hydrocodone-Acetaminophen] Other (See Comments)    NIGHTMARES     Current Outpatient Prescriptions  Medication Sig Dispense Refill  . aspirin EC 81 MG tablet Take 81 mg by mouth daily before breakfast.     . clopidogrel (PLAVIX) 75 MG tablet TAKE 1 TABLET DAILY 90 tablet 0  . isosorbide mononitrate (IMDUR) 30 MG 24 hr tablet TAKE 1 TABLET DAILY BEFORE BREAKFAST 90 tablet 0  . Magnesium 500 MG CAPS Take 1 capsule by mouth daily.     . metoprolol succinate (TOPROL-XL) 25 MG 24 hr tablet TAKE ONE-HALF (1/2) TABLET DAILY 45  tablet 0  . Multiple Vitamin (MULITIVITAMIN WITH MINERALS) TABS Take 1 tablet by mouth daily.     . naproxen sodium (ANAPROX) 220 MG tablet Take 440 mg by mouth 2 (two) times daily as needed. PAIN     . Omega-3 Fatty Acids (FISH OIL) 1200 MG CAPS Take 2 capsules by mouth daily.    . rosuvastatin (CRESTOR) 40 MG tablet Take 1 tablet (40 mg total) by mouth daily. 90 tablet 3  . amLODipine (NORVASC) 5 MG tablet Take 1 tablet (5 mg total) by mouth daily. 90 tablet 3  . ramipril (ALTACE) 10 MG capsule Take 1 capsule (10 mg total) by mouth daily. 90 capsule 3   No current facility-administered medications for this  visit.    Social History   Social History  . Marital Status: Single    Spouse Name: N/A  . Number of Children: N/A  . Years of Education: N/A   Occupational History  . Not on file.   Social History Main Topics  . Smoking status: Former Smoker    Quit date: 08/25/1950  . Smokeless tobacco: Never Used  . Alcohol Use: No  . Drug Use: No  . Sexual Activity: Not on file   Other Topics Concern  . 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  Family history is notable for both parents and all siblings are deceased.   ROS General: Negative; No fevers, chills, or night sweats;  HEENT: Negative; No changes in vision or hearing, sinus congestion, difficulty swallowing Pulmonary: Negative; No cough, wheezing, shortness of breath, hemoptysis Cardiovascular: Negative; No chest pain, presyncope, syncope, palpitations GI: Negative; No nausea, vomiting, diarrhea, or abdominal pain GU: Negative; No dysuria, hematuria, or difficulty voiding Musculoskeletal: Negative; no myalgias, joint pain, or weakness Hematologic/Oncology: Negative; no easy bruising, bleeding Endocrine: Negative; no heat/cold intolerance; no diabetes Neuro: Negative; no changes in balance, headaches Skin: Negative; No rashes or skin lesions Psychiatric: Negative; No behavioral problems, depression Sleep: Negative; No snoring, daytime sleepiness, hypersomnolence, bruxism, restless legs, hypnogognic hallucinations, no cataplexy Other comprehensive 14 point system review is negative.  PE BP 110/62 mmHg  Pulse 56  Ht '5\' 6"'  (1.676 m)  Wt 179 lb (81.194 kg)  BMI 28.91 kg/m2   Repeat blood pressure by me 122/70.  Wt Readings from Last  3 Encounters:  01/22/16 179 lb (81.194 kg)  11/24/14 177 lb 6.4 oz (80.468 kg)  09/28/13 186 lb 8 oz (84.596 kg)   General: Alert, oriented, no distress.  He appears younger than his stated age of 73. Skin: normal turgor, no rashes HEENT: Normocephalic, atraumatic. Pupils round and reactive; sclera anicteric;no lid lag.  Nose without nasal septal hypertrophy Mouth/Parynx benign; Mallinpatti scale 2 Neck: No JVD, no carotid bruits with normal carotid upstroke Lungs: clear to ausculatation and percussion; no wheezing or rales Chest wall: No tenderness to palpation Heart: RRR, s1 s2 normal 1/6 systolic murmur, no S3 or S4 gallop.  No diastolic murmur.  No rubs thrills or heaves. Abdomen: Mild diastases recti;soft, nontender; no hepatosplenomehaly, BS+; abdominal aorta nontender and not dilated by palpation. Back: No CVA Pulses 2+ Extremities: no clubbing cyanosis or edema, Homan's sign negative  Neurologic: grossly nonfocal; cranial nerves intact Psychological: Normal affect and mood.  ECG (independently read by me): Sinus bradycardia at 56 bpm with first-degree AV block with a PR interval at 228 ms.  Right bundle-branch block with repolarization changes.  Old inferior MI.  Anterolateral Q waves.  April 2016 ECG (independently read by me): Sinus bradycardia 58 bpm.  Left axis deviation.  Poor progression V1 through V4.  No significant ST-T changes.  February 2015 ECG (independently read by me): Sinus bradycardia 57 beats per minute. Isolated PVC.  Prior ECG of August 2014 : Sinus bradycardia at 48 beats per minute with mild sinus arrhythmia. Poor anterior R-wave progression compatible with his old anterior wall MI.  LABS:  BMP Latest Ref Rng 01/22/2016 11/24/2014 09/28/2013  Glucose 65 - 99 mg/dL 193(H) 140(H) 155(H)  BUN 7 - 25 mg/dL '21 18 15  ' Creatinine 0.70 - 1.11 mg/dL 1.02 0.89 0.97  Sodium 135 - 146 mmol/L 137 139 140  Potassium 3.5 - 5.3 mmol/L 4.7 5.2 4.5  Chloride 98 - 110  mmol/L 102 103 104  CO2 20 - 31 mmol/L '24 25 26  ' Calcium 8.6 - 10.3 mg/dL 9.2 9.6 9.5    Hepatic Function Latest Ref Rng 01/22/2016 11/24/2014 12/05/2013  Total Protein 6.1 - 8.1 g/dL 6.7 7.6 6.9  Albumin 3.6 - 5.1 g/dL 4.4 4.8 4.5  AST 10 - 35 U/L 48(H) 35 47(H)  ALT 9 - 46 U/L 43 29 47  Alk Phosphatase 40 - 115 U/L 122(H) 115 111  Total Bilirubin 0.2 - 1.2 mg/dL 0.8 0.7 0.5  Bilirubin, Direct 0.0 - 0.3 mg/dL - - 0.1    CBC Latest Ref Rng 01/22/2016 11/24/2014 09/28/2013  WBC 3.8 - 10.8 K/uL 5.8 5.8 6.0  Hemoglobin 13.2 - 17.1 g/dL 13.7 14.6 13.9  Hematocrit 38.5 - 50.0 % 40.6 43.1 40.2  Platelets 140 - 400 K/uL 151 154 159   Lab Results  Component Value Date   MCV 90.8 01/22/2016   MCV 89.4 11/24/2014   MCV 90.1 09/28/2013    Lab Results  Component Value Date   TSH 0.88 01/22/2016    No results found for: HGBA1C  BNP No results found for: PROBNP    Lipid Panel     Component Value Date/Time   CHOL 108* 01/22/2016 1056   TRIG 180* 01/22/2016 1056   HDL 37* 01/22/2016 1056   CHOLHDL 2.9 01/22/2016 1056   VLDL 36* 01/22/2016 1056   LDLCALC 35 01/22/2016 1056      RADIOLOGY: No results found.    ASSESSMENT AND PLAN: [name] [name] is a  young appearing 82 year old active gentleman who is 15 years following his anterior wall myocardial infarction with successful acute intervention in May 2002. He has  documentation of significant myocardial salvage. He does have only a small area of distal LAD scar. Clinically he remains active and is doing well without significant symptoms.  Remotely had developed significant bradycardia leading to dose reduction of his Toprol to 12.5 mg.  he currently is asymptomatic but continues to demonstrate sinus bradycardia 56 bpm.  He has first-degree AV block.  There is right bundle branch block.  He is without any recurrent anginal symptoms.  His blood pressure today is well controlled on his medical regimen consisting of amlodipine 5 mg, Toprol  12.5 mg in addition to his isosorbide mononitrate 30 mg.  As hyperlipidemia and we have been aggressive with lipid lowering therapy on Crestor 40 mg in addition to his fish oil.  He was fasting today.  Repeat blood work has shown an excellent cholesterol at 108 with an LDL of 35.  Triglycerides are mildly increased at 180 and VLDL is minimally increased at 36.  We discussed diet.  He will continue to be active.  He is not having any bleeding issues with aspirin and Plavix.  I will see him in one year for reevaluation.  Time spent: 25 minutes  Troy Sine, MD, [facility]  01/23/2016 10:28 PM

## 2016-02-07 ENCOUNTER — Telehealth: Payer: Self-pay | Admitting: *Deleted

## 2016-02-07 NOTE — Telephone Encounter (Signed)
-----   Message from Troy Sine, MD sent at 02/03/2016 10:51 AM EDT ----- Inc TG; decrease sugars/carbs

## 2016-02-07 NOTE — Telephone Encounter (Signed)
Left lab results and recommendations on patient home VM ( ok per DPR). Call back with questions or concerns.

## 2016-02-20 ENCOUNTER — Other Ambulatory Visit: Payer: Self-pay | Admitting: Cardiovascular Disease

## 2016-02-20 NOTE — Telephone Encounter (Signed)
Rx request sent to pharmacy.  

## 2016-02-27 ENCOUNTER — Other Ambulatory Visit: Payer: Self-pay | Admitting: Cardiovascular Disease

## 2016-05-06 ENCOUNTER — Telehealth: Payer: Self-pay | Admitting: Cardiovascular Disease

## 2016-05-06 NOTE — Telephone Encounter (Signed)
Returned call. Gordy Clement updated fax number for our office and advised to send clearance to attn Dr. Claiborne Billings. She voiced thanks and acknowledgment. No further requests at this time.

## 2016-05-06 NOTE — Telephone Encounter (Signed)
New Message   [facility] from [place] requesting to speak with RN to f/u on medical clearance that what fax to our office from Dr Hassell Done.  Please call back to discuss

## 2016-05-07 ENCOUNTER — Telehealth: Payer: Self-pay | Admitting: *Deleted

## 2016-05-07 NOTE — Telephone Encounter (Signed)
Clearance for cystourethroscopy and the ok for the pt to hold plavix and aspirin for 5 days prior faxed to the number provided.

## 2016-05-08 ENCOUNTER — Telehealth: Payer: Self-pay

## 2016-05-08 NOTE — Telephone Encounter (Signed)
Received surgical clearance from [facility].Dr.Kelly cleared patient for surgery.Advised ok to hold aspirin and plavix 5 days prior to surgery.Form faxed back to fax # [phone].

## 2016-05-11 ENCOUNTER — Emergency Department
Admission: EM | Admit: 2016-05-11 | Discharge: 2016-05-11 | Disposition: A | Discharge status: Home / Self Care | Payer: Medicare Other | Attending: Emergency Medicine | Admitting: Emergency Medicine

## 2016-05-11 ENCOUNTER — Emergency Department: Payer: Medicare Other

## 2016-05-11 DIAGNOSIS — Z791 Long term (current) use of non-steroidal anti-inflammatories (NSAID): Secondary | ICD-10-CM | POA: Insufficient documentation

## 2016-05-11 DIAGNOSIS — W1839XA Other fall on same level, initial encounter: Secondary | ICD-10-CM | POA: Insufficient documentation

## 2016-05-11 DIAGNOSIS — Y999 Unspecified external cause status: Secondary | ICD-10-CM | POA: Diagnosis not present

## 2016-05-11 DIAGNOSIS — M25552 Pain in left hip: Secondary | ICD-10-CM | POA: Diagnosis present

## 2016-05-11 DIAGNOSIS — W19XXXA Unspecified fall, initial encounter: Secondary | ICD-10-CM

## 2016-05-11 DIAGNOSIS — Z87891 Personal history of nicotine dependence: Secondary | ICD-10-CM | POA: Insufficient documentation

## 2016-05-11 DIAGNOSIS — I252 Old myocardial infarction: Secondary | ICD-10-CM | POA: Diagnosis not present

## 2016-05-11 DIAGNOSIS — T148XXA Other injury of unspecified body region, initial encounter: Secondary | ICD-10-CM

## 2016-05-11 DIAGNOSIS — I2510 Atherosclerotic heart disease of native coronary artery without angina pectoris: Secondary | ICD-10-CM | POA: Insufficient documentation

## 2016-05-11 DIAGNOSIS — Z79899 Other long term (current) drug therapy: Secondary | ICD-10-CM | POA: Insufficient documentation

## 2016-05-11 DIAGNOSIS — Y9389 Activity, other specified: Secondary | ICD-10-CM | POA: Diagnosis not present

## 2016-05-11 DIAGNOSIS — I10 Essential (primary) hypertension: Secondary | ICD-10-CM | POA: Diagnosis not present

## 2016-05-11 DIAGNOSIS — Z7982 Long term (current) use of aspirin: Secondary | ICD-10-CM | POA: Insufficient documentation

## 2016-05-11 DIAGNOSIS — Y929 Unspecified place or not applicable: Secondary | ICD-10-CM | POA: Diagnosis not present

## 2016-05-11 DIAGNOSIS — Z85828 Personal history of other malignant neoplasm of skin: Secondary | ICD-10-CM | POA: Insufficient documentation

## 2016-05-11 DIAGNOSIS — T148 Other injury of unspecified body region: Secondary | ICD-10-CM | POA: Insufficient documentation

## 2016-05-11 DIAGNOSIS — Z96651 Presence of right artificial knee joint: Secondary | ICD-10-CM | POA: Insufficient documentation

## 2016-05-11 MED ORDER — OXYCODONE-ACETAMINOPHEN 5-325 MG PO TABS: 1.0000 | ORAL_TABLET | Freq: Four times a day (QID) | ORAL | 0 refills | Status: DC | PRN

## 2016-05-11 MED ORDER — OXYCODONE-ACETAMINOPHEN 5-325 MG PO TABS
1.0000 | ORAL_TABLET | Freq: Once | ORAL | Status: AC
  Administered 2016-05-11: 1 via ORAL
  Filled 2016-05-11: qty 1

## 2016-05-11 NOTE — ED Notes (Signed)
Pt able to ambulate. Will notify MD.

## 2016-05-11 NOTE — ED Provider Notes (Signed)
[facility] Emergency Department Provider Note  Time seen: 8:49 AM  I have reviewed the triage vital signs and the nursing notes.   HISTORY  Chief Complaint Fall and Hip Pain    HPI [name] [name] is a 82 y.o. male who presents to the emergency department after a fall. According to the patient he was wiping his feet when he fell backwards landing on his buttocks. Denies hitting his head. Denies LOC. Patient states he is having pain in the left hip. Describes the pain as mild and lying flat, moderate when moving the hip or attempting to stand.  Past Medical History:  Diagnosis Date  . Arthritis    arthritis in knees   . Cancer (HCC)    hx of basal cell carcinoma   . Coronary artery disease 07/30/11   Nuclear stress test-post-test EF is 45% Lowrisk scan . when compard to previouc study there is no significant change.  Marland Kitchen GERD (gastroesophageal reflux disease)   . Hypertension 09/19/10   ECHO-EF53% The transmitral spectral doppler flow pattern suggestive of impairded LV relaxation--likely normal for age with no evidence of ^ LA pressures or LV filling pressures. IVC is dilated, measureing 2.2cm, but unable to assess respirophasic variation.The dilated IVC would suggest ^ CVP/RAP of ~ 10-15 mmHg. RVSP is estimated at 25-35 mmHg consistant with borderline elevated pressures.  . Myocardial infarction ([place])    2002, stent implanted   . Stroke [facility])    TIA 05/2011     Patient Active Problem List   Diagnosis Date Noted  . First degree AV block 01/23/2016  . Right bundle branch block 01/23/2016  . Dizziness 09/28/2013  . CAD (coronary artery disease) 03/29/2013  . Bradycardia 03/29/2013  . Hyperlipidemia with target LDL less than 70 03/29/2013  . Essential hypertension 03/29/2013    Past Surgical History:  Procedure Laterality Date  . CARDIAC CATHETERIZATION    . CORONARY ANGIOPLASTY     stent 2002   . HERNIA REPAIR     bilateral   . TOTAL KNEE  ARTHROPLASTY  09/12/2011   Procedure: TOTAL KNEE ARTHROPLASTY;  Surgeon: Johnn Hai, MD;  Location: [facility]S;  Service: Orthopedics;  Laterality: Left;    Prior to Admission medications   Medication Sig Start Date End Date Taking? Authorizing Provider  amLODipine (NORVASC) 5 MG tablet Take 1 tablet (5 mg total) by mouth daily. 01/22/16   Troy Sine, MD  aspirin EC 81 MG tablet Take 81 mg by mouth daily before breakfast.     Historical Provider, MD  clopidogrel (PLAVIX) 75 MG tablet TAKE 1 TABLET DAILY 02/27/16   Troy Sine, MD  isosorbide mononitrate (IMDUR) 30 MG 24 hr tablet TAKE 1 TABLET DAILY BEFORE BREAKFAST 02/27/16   Troy Sine, MD  Magnesium 500 MG CAPS Take 1 capsule by mouth daily.     Historical Provider, MD  metoprolol succinate (TOPROL-XL) 25 MG 24 hr tablet TAKE ONE-HALF (1/2) TABLET DAILY 02/27/16   Troy Sine, MD  Multiple Vitamin (MULITIVITAMIN WITH MINERALS) TABS Take 1 tablet by mouth daily.     Historical Provider, MD  naproxen sodium (ANAPROX) 220 MG tablet Take 440 mg by mouth 2 (two) times daily as needed. PAIN     Historical Provider, MD  Omega-3 Fatty Acids (FISH OIL) 1200 MG CAPS Take 2 capsules by mouth daily.    Historical Provider, MD  ramipril (ALTACE) 10 MG capsule Take 1 capsule (10 mg total) by mouth daily.  01/22/16   Troy Sine, MD  rosuvastatin (CRESTOR) 40 MG tablet TAKE 1 TABLET DAILY 02/20/16   Troy Sine, MD    Allergies  Allergen Reactions  . Celebrex [Celecoxib]   . Vicodin [Hydrocodone-Acetaminophen] Other (See Comments)    NIGHTMARES     No family history on file.  Social History Social History  Substance Use Topics  . Smoking status: Former Smoker    Quit date: 08/25/1950  . Smokeless tobacco: Never Used  . Alcohol use No    Review of Systems Constitutional: Negative for fever Cardiovascular: Negative for chest pain. Respiratory: Negative for shortness of breath. Gastrointestinal: Negative for abdominal pain   Musculoskeletal: Pelvis pain/left hip pain. Neurological: Negative for headache. Denies weakness or numbness. 10-point ROS otherwise negative.  ____________________________________________   PHYSICAL EXAM:  VITAL SIGNS: ED Triage Vitals  Enc Vitals Group     BP 05/11/16 0821 (!) 148/64     Pulse Rate 05/11/16 0821 62     Resp 05/11/16 0821 16     Temp 05/11/16 0821 99 F (37.2 C)     Temp Source 05/11/16 0821 Oral     SpO2 05/11/16 0821 95 %     Weight 05/11/16 0819 177 lb (80.3 kg)     Height 05/11/16 0819 5\' 6"  (1.676 m)     Head Circumference --      Peak Flow --      Pain Score --      Pain Loc --      Pain Edu? --      Excl. in [place]? --     Constitutional: Alert and oriented. Well appearing and in no distress.Appears Younger than stated age. Eyes: Normal exam ENT   Head: Normocephalic and atraumatic   Mouth/Throat: Mucous membranes are moist. Cardiovascular: Normal rate, regular rhythm.  Respiratory: Normal respiratory effort without tachypnea nor retractions. Breath sounds are clear Gastrointestinal: Soft and nontender. No distention.  Musculoskeletal: Mild left hip/pelvis tenderness to palpation. Good range of motion in bilateral hips including the left hip. Neurovascular intact bilaterally with 2+ DP pulses. Neurologic:  Normal speech and language. No gross focal neurologic deficits  Skin:  Skin is warm, dry and intact.  Psychiatric: Mood and affect are normal.   ____________________________________________     RADIOLOGY  X-ray negative for fracture.  ____________________________________________   INITIAL IMPRESSION / ASSESSMENT AND PLAN / ED COURSE  Pertinent labs & imaging results that were available during my care of the patient were reviewed by me and considered in my medical decision making (see chart for details).  Very well-appearing 82 year old patient presents after a fall landing on his buttocks. Patient has left hip/pelvis pain but  good range of motion in the left hip. Suspect possible pelvis fracture. We'll obtain x-rays of left hip and pelvis to further evaluate. Patient states the pain is minimal when lying flat, moderate when attempting to stand/ambulate.  X-rays negative. We will treat the patient's discomfort with Percocet 1. Patient states he has taken oxycodone in the past without issue. We will attempt ambulation. The patient is able to ambulate we will discharge her pain medication, he is unable to ambulate will proceed with further imaging such as CT or MRI.  Patient has been able to ambulate. We'll discharge with pain medication and PCP follow-up. Patient and family agreeable.  ____________________________________________   FINAL CLINICAL IMPRESSION(S) / ED DIAGNOSES  Pelvis pain Fall Contusion   Harvest Dark, MD 05/11/16 1026

## 2016-05-11 NOTE — ED Triage Notes (Signed)
Pt came to ED via EMS c/o left hip pain after mechanical fall. No LOC, pt denies hitting head. Pt on plavix and asa. Reports knee gave out on him and he fell, history of right knee replacement.

## 2016-05-27 ENCOUNTER — Other Ambulatory Visit: Payer: Self-pay | Admitting: Cardiovascular Disease

## 2016-05-27 NOTE — Telephone Encounter (Signed)
Rx request sent to pharmacy.  

## 2016-09-03 ENCOUNTER — Inpatient Hospital Stay
Admission: EM | Admit: 2016-09-03 | Discharge: 2016-09-04 | DRG: 195 | Disposition: A | Discharge status: Home / Self Care | Payer: Medicare HMO | Attending: Internal Medicine | Admitting: Internal Medicine

## 2016-09-03 ENCOUNTER — Emergency Department: Payer: Medicare HMO

## 2016-09-03 ENCOUNTER — Encounter: Payer: Self-pay | Admitting: *Deleted

## 2016-09-03 DIAGNOSIS — Z888 Allergy status to other drugs, medicaments and biological substances status: Secondary | ICD-10-CM | POA: Diagnosis not present

## 2016-09-03 DIAGNOSIS — M1990 Unspecified osteoarthritis, unspecified site: Secondary | ICD-10-CM | POA: Diagnosis present

## 2016-09-03 DIAGNOSIS — I252 Old myocardial infarction: Secondary | ICD-10-CM | POA: Diagnosis not present

## 2016-09-03 DIAGNOSIS — J181 Lobar pneumonia, unspecified organism: Secondary | ICD-10-CM

## 2016-09-03 DIAGNOSIS — I10 Essential (primary) hypertension: Secondary | ICD-10-CM | POA: Diagnosis present

## 2016-09-03 DIAGNOSIS — Z96652 Presence of left artificial knee joint: Secondary | ICD-10-CM | POA: Diagnosis present

## 2016-09-03 DIAGNOSIS — E785 Hyperlipidemia, unspecified: Secondary | ICD-10-CM | POA: Diagnosis present

## 2016-09-03 DIAGNOSIS — R531 Weakness: Secondary | ICD-10-CM | POA: Diagnosis not present

## 2016-09-03 DIAGNOSIS — Z885 Allergy status to narcotic agent status: Secondary | ICD-10-CM | POA: Diagnosis not present

## 2016-09-03 DIAGNOSIS — Z79899 Other long term (current) drug therapy: Secondary | ICD-10-CM | POA: Diagnosis not present

## 2016-09-03 DIAGNOSIS — Z7982 Long term (current) use of aspirin: Secondary | ICD-10-CM | POA: Diagnosis not present

## 2016-09-03 DIAGNOSIS — Z7902 Long term (current) use of antithrombotics/antiplatelets: Secondary | ICD-10-CM | POA: Diagnosis not present

## 2016-09-03 DIAGNOSIS — Z955 Presence of coronary angioplasty implant and graft: Secondary | ICD-10-CM

## 2016-09-03 DIAGNOSIS — J101 Influenza due to other identified influenza virus with other respiratory manifestations: Secondary | ICD-10-CM | POA: Diagnosis present

## 2016-09-03 DIAGNOSIS — K219 Gastro-esophageal reflux disease without esophagitis: Secondary | ICD-10-CM | POA: Diagnosis present

## 2016-09-03 DIAGNOSIS — J189 Pneumonia, unspecified organism: Secondary | ICD-10-CM

## 2016-09-03 DIAGNOSIS — Z87891 Personal history of nicotine dependence: Secondary | ICD-10-CM | POA: Diagnosis not present

## 2016-09-03 DIAGNOSIS — I2510 Atherosclerotic heart disease of native coronary artery without angina pectoris: Secondary | ICD-10-CM | POA: Diagnosis present

## 2016-09-03 DIAGNOSIS — J1000 Influenza due to other identified influenza virus with unspecified type of pneumonia: Secondary | ICD-10-CM | POA: Diagnosis not present

## 2016-09-03 DIAGNOSIS — Z8673 Personal history of transient ischemic attack (TIA), and cerebral infarction without residual deficits: Secondary | ICD-10-CM

## 2016-09-03 DIAGNOSIS — Z85828 Personal history of other malignant neoplasm of skin: Secondary | ICD-10-CM

## 2016-09-03 DIAGNOSIS — Z9861 Coronary angioplasty status: Secondary | ICD-10-CM

## 2016-09-03 HISTORY — DX: Pneumonia, unspecified organism: J18.9

## 2016-09-03 LAB — CBC
HCT: 37.2 % — ABNORMAL LOW (ref 40.0–52.0)
HEMOGLOBIN: 12.8 g/dL — AB (ref 13.0–18.0)
MCH: 30.8 pg (ref 26.0–34.0)
MCHC: 34.5 g/dL (ref 32.0–36.0)
MCV: 89.5 fL (ref 80.0–100.0)
PLATELETS: 192 10*3/uL (ref 150–440)
RBC: 4.16 MIL/uL — AB (ref 4.40–5.90)
RDW: 13.2 % (ref 11.5–14.5)
WBC: 8 10*3/uL (ref 3.8–10.6)

## 2016-09-03 LAB — BASIC METABOLIC PANEL
Anion gap: 10 (ref 5–15)
BUN: 18 mg/dL (ref 6–20)
CALCIUM: 8.7 mg/dL — AB (ref 8.9–10.3)
CHLORIDE: 96 mmol/L — AB (ref 101–111)
CO2: 24 mmol/L (ref 22–32)
CREATININE: 1.15 mg/dL (ref 0.61–1.24)
GFR calc non Af Amer: 53 mL/min — ABNORMAL LOW (ref >60)
Glucose, Bld: 212 mg/dL — ABNORMAL HIGH (ref 65–99)
Potassium: 4.6 mmol/L (ref 3.5–5.1)
SODIUM: 130 mmol/L — AB (ref 135–145)

## 2016-09-03 LAB — URINALYSIS, COMPLETE (UACMP) WITH MICROSCOPIC
BILIRUBIN URINE: NEGATIVE
Bacteria, UA: NONE SEEN
GLUCOSE, UA: NEGATIVE mg/dL
Hgb urine dipstick: NEGATIVE
KETONES UR: NEGATIVE mg/dL
LEUKOCYTES UA: NEGATIVE
Nitrite: NEGATIVE
PH: 5 (ref 5.0–8.0)
PROTEIN: 30 mg/dL — AB
Specific Gravity, Urine: 1.023 (ref 1.005–1.030)

## 2016-09-03 LAB — RAPID INFLUENZA A&B ANTIGENS
Influenza A (ARMC): POSITIVE — AB
Influenza B (ARMC): NEGATIVE

## 2016-09-03 MED ORDER — OSELTAMIVIR PHOSPHATE 30 MG PO CAPS
30.0000 mg | ORAL_CAPSULE | Freq: Once | ORAL | Status: AC
  Administered 2016-09-03: 30 mg via ORAL
  Filled 2016-09-03: qty 1

## 2016-09-03 MED ORDER — LEVOFLOXACIN IN D5W 750 MG/150ML IV SOLN
750.0000 mg | Freq: Once | INTRAVENOUS | Status: AC
  Administered 2016-09-03: 750 mg via INTRAVENOUS
  Filled 2016-09-03: qty 150

## 2016-09-03 MED ORDER — SODIUM CHLORIDE 0.9 % IV BOLUS (SEPSIS)
500.0000 mL | Freq: Once | INTRAVENOUS | Status: AC
  Administered 2016-09-03: 500 mL via INTRAVENOUS

## 2016-09-03 MED ORDER — ACETAMINOPHEN 500 MG PO TABS
ORAL_TABLET | ORAL | Status: AC
  Administered 2016-09-03: 1000 mg via ORAL
  Filled 2016-09-03: qty 2

## 2016-09-03 MED ORDER — ACETAMINOPHEN 500 MG PO TABS
1000.0000 mg | ORAL_TABLET | Freq: Once | ORAL | Status: AC
  Administered 2016-09-03: 1000 mg via ORAL

## 2016-09-03 NOTE — ED Notes (Signed)
Patient ambulated down hallway while monitoring oxygen sats.  Patient maintained 92-94% while walking.

## 2016-09-03 NOTE — ED Notes (Signed)
ED Provider at bedside. 

## 2016-09-03 NOTE — ED Provider Notes (Signed)
[facility] Emergency Department Provider Note    First MD Initiated Contact with Patient 09/03/16 Vernelle Emerald     (approximate)  I have reviewed the triage vital signs and the nursing notes.   HISTORY  Chief Complaint Weakness    HPI [name] [name] is a 83 y.o. male complaint of weakness that occurred today. Patient brought in by daughter due to primary concern for dehydration and recent diagnosis of pneumonia. Per report the last time the patient ate was lunch yesterday. 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 Upon returning home the patient felt very weak and fell to his knees and had to crawl into the front doorstep. He then called family and was brought to the ER. He denies any chest pain or cough. States he's had a decreased appetite.  States that he feels fine right now. Denies any shortness of breath. States the cough has improved. No fevers or chills. No nausea or vomiting. States he simply forgets to eat.   Past Medical History:  Diagnosis Date  . Arthritis    arthritis in knees   . Cancer (HCC)    hx of basal cell carcinoma   . Coronary artery disease 07/30/11   Nuclear stress test-post-test EF is 45% Lowrisk scan . when compard to previouc study there is no significant change.  Marland Kitchen GERD (gastroesophageal reflux disease)   . Hypertension 09/19/10   ECHO-EF53% The transmitral spectral doppler flow pattern suggestive of impairded LV relaxation--likely normal for age with no evidence of ^ LA pressures or LV filling pressures. IVC is dilated, measureing 2.2cm, but unable to assess respirophasic variation.The dilated IVC would suggest ^ CVP/RAP of ~ 10-15 mmHg. RVSP is estimated at 25-35 mmHg consistant with borderline elevated pressures.  . Myocardial infarction    2002, stent implanted   . Stroke [facility])    TIA 05/2011    History reviewed. No pertinent family history. Past Surgical History:  Procedure Laterality Date  . CARDIAC  CATHETERIZATION    . CORONARY ANGIOPLASTY     stent 2002   . HERNIA REPAIR     bilateral   . TOTAL KNEE ARTHROPLASTY  09/12/2011   Procedure: TOTAL KNEE ARTHROPLASTY;  Surgeon: Johnn Hai, MD;  Location: [facility];  Service: Orthopedics;  Laterality: Left;   Patient Active Problem List   Diagnosis Date Noted  . First degree AV block 01/23/2016  . Right bundle branch block 01/23/2016  . Dizziness 09/28/2013  . CAD (coronary artery disease) 03/29/2013  . Bradycardia 03/29/2013  . Hyperlipidemia with target LDL less than 70 03/29/2013  . Essential hypertension 03/29/2013      Prior to Admission medications   Medication Sig Start Date End Date Taking? Authorizing Provider  amLODipine (NORVASC) 5 MG tablet Take 1 tablet (5 mg total) by mouth daily. 01/22/16  Yes Troy Sine, MD  aspirin EC 81 MG tablet Take 81 mg by mouth daily before breakfast.    Yes Historical Provider, MD  clopidogrel (PLAVIX) 75 MG tablet TAKE 1 TABLET DAILY 05/27/16  Yes Troy Sine, MD  guaiFENesin (MUCINEX) 600 MG 12 hr tablet Take by mouth.   Yes Historical Provider, MD  isosorbide mononitrate (IMDUR) 30 MG 24 hr tablet TAKE 1 TABLET DAILY BEFORE BREAKFAST 02/27/16  Yes Troy Sine, MD  Magnesium 500 MG CAPS Take 1 capsule by mouth daily.    Yes Historical Provider, MD  metoprolol succinate (TOPROL-XL) 25 MG 24 hr tablet  TAKE ONE-HALF (1/2) TABLET DAILY 05/27/16  Yes Troy Sine, MD  Multiple Vitamin (MULITIVITAMIN WITH MINERALS) TABS Take 1 tablet by mouth daily.    Yes Historical Provider, MD  Omega-3 Fatty Acids (FISH OIL) 1200 MG CAPS Take 2 capsules by mouth daily.   Yes Historical Provider, MD  ramipril (ALTACE) 10 MG capsule Take 1 capsule (10 mg total) by mouth daily. 01/22/16  Yes Troy Sine, MD  rosuvastatin (CRESTOR) 40 MG tablet TAKE 1 TABLET DAILY 02/20/16  Yes Troy Sine, MD  naproxen sodium (ANAPROX) 220 MG tablet Take 440 mg by mouth 2 (two) times daily as needed. PAIN      Historical Provider, MD  oxyCODONE-acetaminophen (ROXICET) 5-325 MG tablet Take 1 tablet by mouth every 6 (six) hours as needed. Patient not taking: Reported on 09/03/2016 05/11/16   Harvest Dark, MD    Allergies Celebrex [celecoxib] and Vicodin [hydrocodone-acetaminophen]    Social History Social History  Substance Use Topics  . Smoking status: Former Smoker    Quit date: 08/25/1950  . Smokeless tobacco: Never Used  . Alcohol use No    Review of Systems Patient denies headaches, rhinorrhea, blurry vision, numbness, shortness of breath, chest pain, edema, cough, abdominal pain, nausea, vomiting, diarrhea, dysuria, fevers, rashes or hallucinations unless otherwise stated above in HPI. ____________________________________________   PHYSICAL EXAM:  VITAL SIGNS: Vitals:   09/03/16 1146 09/03/16 1314  BP: (!) 97/43 (!) 104/52  Pulse: 67 68  Resp: 18 16  Temp: 99.3 F (37.4 C)     Constitutional: Alert and oriented. Eyes: Conjunctivae are normal. PERRL. EOMI. Head: Atraumatic. Nose: No congestion/rhinnorhea. Mouth/Throat: Mucous membranes are moist.  Oropharynx non-erythematous. Neck: No stridor. Painless ROM. No cervical spine tenderness to palpation Hematological/Lymphatic/Immunilogical: No cervical lymphadenopathy. Cardiovascular: Normal rate, regular rhythm. Grossly normal heart sounds.  Good peripheral circulation. Respiratory: Normal respiratory effort.  No retractions. Lungs CTAB. Gastrointestinal: Soft and nontender. No distention. No abdominal bruits. No CVA tenderness. Musculoskeletal: No lower extremity tenderness nor edema.  No joint effusions. Neurologic:  Skin:  Skin is warm, dry and intact. No rash noted. Psychiatric: Mood and affect are normal. Speech and behavior are normal. ____________________________________   LABS (all labs ordered are listed, but only abnormal results are displayed)  Results for orders placed or performed during the hospital  encounter of 09/03/16 (from the past 24 hour(s))  Basic metabolic panel     Status: Abnormal   Collection Time: 09/03/16 11:45 AM  Result Value Ref Range   Sodium 130 (L) 135 - 145 mmol/L   Potassium 4.6 3.5 - 5.1 mmol/L   Chloride 96 (L) 101 - 111 mmol/L   CO2 24 22 - 32 mmol/L   Glucose, Bld 212 (H) 65 - 99 mg/dL   BUN 18 6 - 20 mg/dL   Creatinine, Ser 1.15 0.61 - 1.24 mg/dL   Calcium 8.7 (L) 8.9 - 10.3 mg/dL   GFR calc non Af Amer 53 (L) >60 mL/min   GFR calc Af Amer >60 >60 mL/min   Anion gap 10 5 - 15  CBC     Status: Abnormal   Collection Time: 09/03/16 11:45 AM  Result Value Ref Range   WBC 8.0 3.8 - 10.6 K/uL   RBC 4.16 (L) 4.40 - 5.90 MIL/uL   Hemoglobin 12.8 (L) 13.0 - 18.0 g/dL   HCT 37.2 (L) 40.0 - 52.0 %   MCV 89.5 80.0 - 100.0 fL   MCH 30.8 26.0 - 34.0 pg  MCHC 34.5 32.0 - 36.0 g/dL   RDW 13.2 11.5 - 14.5 %   Platelets 192 150 - 440 K/uL  Urinalysis, Complete w Microscopic     Status: Abnormal   Collection Time: 09/03/16  6:10 PM  Result Value Ref Range   Color, Urine YELLOW (A) YELLOW   APPearance CLEAR (A) CLEAR   Specific Gravity, Urine 1.023 1.005 - 1.030   pH 5.0 5.0 - 8.0   Glucose, UA NEGATIVE NEGATIVE mg/dL   Hgb urine dipstick NEGATIVE NEGATIVE   Bilirubin Urine NEGATIVE NEGATIVE   Ketones, ur NEGATIVE NEGATIVE mg/dL   Protein, ur 30 (A) NEGATIVE mg/dL   Nitrite NEGATIVE NEGATIVE   Leukocytes, UA NEGATIVE NEGATIVE   RBC / HPF 0-5 0 - 5 RBC/hpf   WBC, UA 0-5 0 - 5 WBC/hpf   Bacteria, UA NONE SEEN NONE SEEN   Squamous Epithelial / LPF 0-5 (A) NONE SEEN   Mucous PRESENT    ____________________________________________  EKG My review and personal interpretation at Time: 11:50 Indication: weakness  Rate: 70  Rhythm: sinus Axis: left Other: rbbb, no STEMI ____________________________________________  RADIOLOGY  I personally reviewed all radiographic images ordered to evaluate for the above acute complaints and reviewed radiology reports and  findings.  These findings were personally discussed with the patient.  Please see medical record for radiology report. ____________________________________________   PROCEDURES  Procedure(s) performed:  Procedures    Critical Care performed: no ____________________________________________   INITIAL IMPRESSION / ASSESSMENT AND PLAN / ED COURSE  Pertinent labs & imaging results that were available during my care of the patient were reviewed by me and considered in my medical decision making (see chart for details).  DDX: pna, sepsis, dehydration, hypoglycemia, chf, acs  ULA YINGER is a 83 y.o. who presents to the ED with above complaints. Patient arrives afebrile with borderline low blood pressure. Patient relatively well appearing. With recent treatment for pneumonia on antibiotics with Will order evaluation for possible sepsis or pneumonia. Given a prescription of decreased oral intake over the past 12 hours and is concerned that he is weak due to dehydration and poor oral intake. The patient will be placed on continuous pulse oximetry and telemetry for monitoring.  Laboratory evaluation will be sent to evaluate for the above complaints.     Clinical Course as of Sep 03 2353  Wed Sep 03, 2016  [id number] Patient feeling better after IV fluids and has tolerated oral hydration. Patient states that he was treated for pneumonia over a month ago. States the cough and shortness of breath has resolved. Do not feel this recurrence of pneumonia.  [PR]  2103 Pharm confirmed patient started on minocycline 2 weeks ago.    [PR]  2104 Will give levaquin.  Patient with influenza positive.  AS he is now febrile with CAP and flu, have recommended admission for frurther evaluation and management.  Spoke with hospitalist who agrees.  Have discussed with the patient and available family all diagnostics and treatments performed thus far and all questions were answered to the best of my ability. The patient  demonstrates understanding and agreement with plan.   [PR]    Clinical Course User Index [PR] Merlyn Lot, MD     ____________________________________________   FINAL CLINICAL IMPRESSION(S) / ED DIAGNOSES  Final diagnoses:  Community acquired pneumonia of right lower lobe of lung ([place])  Influenza A      NEW MEDICATIONS STARTED DURING THIS VISIT:  New Prescriptions   No medications on  file     Note:  This document was prepared using Dragon voice recognition software and may include unintentional dictation errors.    Merlyn Lot, MD 09/03/16 [phone]

## 2016-09-03 NOTE — ED Notes (Signed)
Patient in [place].  Daughter at side.  Given water to drink.

## 2016-09-03 NOTE — ED Triage Notes (Signed)
States he has been diagnosed with pneumonia recently, family states he has been progressivly weaker over the past week, states he is unsure if he was given abx, at present states feeling weak, productive clear cough

## 2016-09-04 LAB — CBC
HCT: 36.3 % — ABNORMAL LOW (ref 40.0–52.0)
Hemoglobin: 12.5 g/dL — ABNORMAL LOW (ref 13.0–18.0)
MCH: 30.9 pg (ref 26.0–34.0)
MCHC: 34.4 g/dL (ref 32.0–36.0)
MCV: 89.6 fL (ref 80.0–100.0)
PLATELETS: 158 10*3/uL (ref 150–440)
RBC: 4.05 MIL/uL — AB (ref 4.40–5.90)
RDW: 13.6 % (ref 11.5–14.5)
WBC: 4.8 10*3/uL (ref 3.8–10.6)

## 2016-09-04 LAB — BASIC METABOLIC PANEL
Anion gap: 7 (ref 5–15)
BUN: 19 mg/dL (ref 6–20)
CALCIUM: 8.4 mg/dL — AB (ref 8.9–10.3)
CO2: 28 mmol/L (ref 22–32)
CREATININE: 1.12 mg/dL (ref 0.61–1.24)
Chloride: 97 mmol/L — ABNORMAL LOW (ref 101–111)
GFR calc non Af Amer: 55 mL/min — ABNORMAL LOW (ref >60)
Glucose, Bld: 160 mg/dL — ABNORMAL HIGH (ref 65–99)
Potassium: 4 mmol/L (ref 3.5–5.1)
Sodium: 132 mmol/L — ABNORMAL LOW (ref 135–145)

## 2016-09-04 MED ORDER — RAMIPRIL 10 MG PO CAPS
10.0000 mg | ORAL_CAPSULE | Freq: Every day | ORAL | Status: DC
  Administered 2016-09-04: 10 mg via ORAL
  Filled 2016-09-04: qty 1

## 2016-09-04 MED ORDER — ENOXAPARIN SODIUM 40 MG/0.4ML ~~LOC~~ SOLN: 40.0000 mg | SUBCUTANEOUS | Status: DC

## 2016-09-04 MED ORDER — BENZONATATE 200 MG PO CAPS: 200.0000 mg | ORAL_CAPSULE | Freq: Three times a day (TID) | ORAL | 0 refills | Status: DC | PRN

## 2016-09-04 MED ORDER — CLOPIDOGREL BISULFATE 75 MG PO TABS
75.0000 mg | ORAL_TABLET | Freq: Every day | ORAL | Status: DC
Start: 2016-09-04 — End: 2016-09-04
  Administered 2016-09-04: 75 mg via ORAL
  Filled 2016-09-04: qty 1

## 2016-09-04 MED ORDER — OSELTAMIVIR PHOSPHATE 30 MG PO CAPS
30.0000 mg | ORAL_CAPSULE | Freq: Two times a day (BID) | ORAL | Status: DC
  Administered 2016-09-04 (×2): 30 mg via ORAL
  Filled 2016-09-04 (×2): qty 1

## 2016-09-04 MED ORDER — METOPROLOL SUCCINATE ER 25 MG PO TB24
12.5000 mg | ORAL_TABLET | Freq: Every day | ORAL | Status: DC
  Administered 2016-09-04: 12.5 mg via ORAL
  Filled 2016-09-04: qty 1

## 2016-09-04 MED ORDER — ISOSORBIDE MONONITRATE ER 30 MG PO TB24
30.0000 mg | ORAL_TABLET | Freq: Every day | ORAL | Status: DC
  Administered 2016-09-04: 30 mg via ORAL
  Filled 2016-09-04: qty 1

## 2016-09-04 MED ORDER — IPRATROPIUM-ALBUTEROL 0.5-2.5 (3) MG/3ML IN SOLN: 3.0000 mL | RESPIRATORY_TRACT | Status: DC | PRN

## 2016-09-04 MED ORDER — ACETAMINOPHEN 325 MG PO TABS: 650.0000 mg | ORAL_TABLET | Freq: Four times a day (QID) | ORAL | Status: DC | PRN

## 2016-09-04 MED ORDER — LEVOFLOXACIN 750 MG PO TABS: 750.0000 mg | ORAL_TABLET | Freq: Every day | ORAL | 0 refills | Status: DC

## 2016-09-04 MED ORDER — ROSUVASTATIN CALCIUM 5 MG PO TABS: 40.0000 mg | ORAL_TABLET | Freq: Every day | ORAL | Status: DC

## 2016-09-04 MED ORDER — BENZONATATE 100 MG PO CAPS
200.0000 mg | ORAL_CAPSULE | Freq: Three times a day (TID) | ORAL | Status: DC | PRN
Start: 2016-09-04 — End: 2016-09-04

## 2016-09-04 MED ORDER — ONDANSETRON HCL 4 MG PO TABS: 4.0000 mg | ORAL_TABLET | Freq: Four times a day (QID) | ORAL | Status: DC | PRN

## 2016-09-04 MED ORDER — ACETAMINOPHEN 650 MG RE SUPP: 650.0000 mg | Freq: Four times a day (QID) | RECTAL | Status: DC | PRN

## 2016-09-04 MED ORDER — ONDANSETRON HCL 4 MG/2ML IJ SOLN: 4.0000 mg | Freq: Four times a day (QID) | INTRAMUSCULAR | Status: DC | PRN

## 2016-09-04 MED ORDER — OSELTAMIVIR PHOSPHATE 75 MG PO CAPS: 75.0000 mg | ORAL_CAPSULE | Freq: Two times a day (BID) | ORAL | Status: DC

## 2016-09-04 MED ORDER — ASPIRIN EC 81 MG PO TBEC
81.0000 mg | DELAYED_RELEASE_TABLET | Freq: Every day | ORAL | Status: DC
  Administered 2016-09-04: 81 mg via ORAL
  Filled 2016-09-04: qty 1

## 2016-09-04 MED ORDER — OSELTAMIVIR PHOSPHATE 30 MG PO CAPS: 30.0000 mg | ORAL_CAPSULE | Freq: Two times a day (BID) | ORAL | 0 refills | Status: AC

## 2016-09-04 MED ORDER — GUAIFENESIN-DM 100-10 MG/5ML PO SYRP: 5.0000 mL | ORAL_SOLUTION | ORAL | Status: DC | PRN

## 2016-09-04 NOTE — Plan of Care (Signed)
Problem: Acute Rehab PT Goals(only PT should resolve) Goal: Pt Will Go Supine/Side To Sit Outcome: Progressing Patient will be able to transfer from supine to sit to be able to get out of bed to use restroom at home. Goal: Patient Will Transfer Sit To/From Stand Outcome: Progressing Patient will be able to transfer from sit to stand independently to demonstrate ability to stand and initiate walking to the restroom Goal: Pt Will Ambulate Outcome: Progressing Patient will be able to ambulate independent to be able to walk into house safely when arriving home.

## 2016-09-04 NOTE — Progress Notes (Signed)
DISCHARGE NOTE:  Pt given discharge information and prescriptions (sent escript). Pt verbalized understanding. Pt wheeled to car by staff member.

## 2016-09-04 NOTE — Evaluation (Signed)
Physical Therapy Evaluation Patient Details Name: [name] [name] MRN: [id number] DOB: 1920-11-21 Today's Date: 09/04/2016   History of Present Illness    Patient presents with CAP from home. Admitted to hospital with increased cough and weakness. States he lives by himself and his daughter comes over to help with transporting needs. Hx of L TKA and CAD.  Clinical Impression  Patient is independent with bed mobility and required supervision assist with transfers and ambulation for instability. Patient demonstrates ability to perform marches without UE support but demonstrates increased postural sway with narrow BOS balancing activities. Patient unable to maintain tandem stance and can only perform widened Tandem with BOS. Recommend discharge patient home with Out patient PT to address balance difficulties.     Follow Up Recommendations Outpatient PT    Equipment Recommendations       Recommendations for Other Services       Precautions / Restrictions   Fall     Mobility  Bed Mobility Overal bed mobility: Independent             General bed mobility comments: Patient able to transfer from supine to sit independently without use of hospital rails  Transfers Overall transfer level: Independent Equipment used: None             General transfer comment: Patient able to transfer safely from sitting <-> standing without use of AD  Ambulation/Gait Ambulation/Gait assistance: Supervision Ambulation Distance (Feet): 60 Feet Assistive device: None Gait Pattern/deviations: Decreased step length - left;Decreased step length - right;Decreased stride length;Decreased dorsiflexion - right;Wide base of support   Gait velocity interpretation: Below normal speed for age/gender General Gait Details: Patient demonstrates decreased gait speed with ambulation and requires CGA to perform secondary to unsteadiness on feet.   Stairs            Wheelchair Mobility    Modified  [place] (Stroke Patients Only)       Balance Overall balance assessment: Needs assistance Sitting-balance support: No upper extremity supported Sitting balance-Leahy Scale: Good Sitting balance - Comments: Patient able to balance while sitting without UE support   Standing balance support: No upper extremity supported Standing balance-Leahy Scale: Fair Standing balance comment: Patient requires wide BOS to balance without postural sway Single Leg Stance - Right Leg: 2 Single Leg Stance - Left Leg: 2 Tandem Stance - Right Leg: 0 Tandem Stance - Left Leg: 0 [place] - Eyes Opened: 20 [place] - Eyes Closed: 20 High level balance activites: Other (comment) (Marching wihtout UE support) High Level Balance Comments: Patient able to balance without UE support with feet together EC and marching movement             Pertinent Vitals/Pain Pain Assessment: No/denies pain    Home Living    Family/patient expects to be discharged to: Private residence byWesleyRissell,PT at01/11/180945   Living Arrangements Alone byWesleyRissell,PT at01/11/180945   Available Help at Discharge Family   Type of [place] Access Level entry   Entrance Stairs-Number of Steps    Entrance [place] One level byWesleyRissell,PT at01/11/180945   Bathroom Shower/Tub None byWesleyRissell,PT at01/11/180945   Bathroom Toilet Standard byWesleyRissell,PT at01/11/180945   [place] Other (comment)   Additional Comments Rollator   Prior Function  Level of Independence Independent with assistive device(s)   Comments Patient reports he owns a rollator but does not use it to ambulate at home.   Communication  Communication [facility]  Written Expression  Dominant Hand Right                                        Extremity/Trunk Assessment   Upper Extremity Assessment Upper Extremity Assessment:  Overall WFL for tasks assessed    Lower Extremity Assessment Lower Extremity Assessment: Overall WFL for tasks assessed    Cervical / Trunk Assessment Cervical / Trunk Assessment: Normal  Communication      Cognition Arousal/Alertness: Awake/alert Behavior During Therapy: WFL for tasks assessed/performed Overall Cognitive Status: Within Functional Limits for tasks assessed                      General Comments      Exercises Total Joint Exercises Marching in Standing: AROM;Both;10 reps;Standing Other Exercises Other Exercises: Patient performed sit to stands x 10 with supervision assist, marching in standing x 10 with supervision support   Assessment/Plan    PT Assessment Patient needs continued PT services  PT Problem List Decreased strength;Decreased balance;Decreased mobility;Decreased coordination          PT Treatment Interventions Gait training;Stair training;Therapeutic activities;Therapeutic exercise;Balance training;Patient/family education;Neuromuscular re-education    PT Goals (Current goals can be found in the Care Plan section)  Acute Rehab PT Goals Patient Stated Goal: Patient reports he would like to go home  PT Goal Formulation: With patient Time For Goal Achievement: 09/18/16 Potential to Achieve Goals: Good    Frequency Min 2X/week   Barriers to discharge        Co-evaluation               End of Session Equipment Utilized During Treatment: Gait belt Activity Tolerance: Patient tolerated treatment well;No increased pain Patient left: in bed;with call bell/phone within reach Nurse Communication: Mobility status         Time: [id number] PT Time Calculation (min) (ACUTE ONLY): 27 min   Charges:   PT Evaluation $PT Eval Low Complexity: 1 Procedure PT Treatments $Gait Training: 8-22 mins $Therapeutic Exercise: 8-22 mins   PT G Codes:        Blythe Stanford, PT DPT 09/04/2016, 5:33 PM

## 2016-09-04 NOTE — H&P (Signed)
[place] [facility] [place] NAME: [name]    MR#:  [id number]  DATE OF BIRTH:  November 16, 1920  DATE OF ADMISSION:  09/03/2016  PRIMARY CARE PHYSICIAN: Nathaneil Canary, PA-C   REQUESTING/REFERRING PHYSICIAN: Quentin Cornwall, MD  CHIEF COMPLAINT:   Chief Complaint  Patient presents with  . Weakness    HISTORY OF PRESENT ILLNESS:  [name] [name]  is a 83 y.o. male who presents with Weakness. Patient was recently treated for pneumonia. Here in the ED today is found to be influenza A positive, and to have likely right-sided pneumonia on imaging. Hospitalists were called for admission  PAST MEDICAL HISTORY:   Past Medical History:  Diagnosis Date  . Arthritis    arthritis in knees   . Cancer (HCC)    hx of basal cell carcinoma   . Coronary artery disease 07/30/11   Nuclear stress test-post-test EF is 45% Lowrisk scan . when compard to previouc study there is no significant change.  Marland Kitchen GERD (gastroesophageal reflux disease)   . Hypertension 09/19/10   ECHO-EF53% The transmitral spectral doppler flow pattern suggestive of impairded LV relaxation--likely normal for age with no evidence of ^ LA pressures or LV filling pressures. IVC is dilated, measureing 2.2cm, but unable to assess respirophasic variation.The dilated IVC would suggest ^ CVP/RAP of ~ 10-15 mmHg. RVSP is estimated at 25-35 mmHg consistant with borderline elevated pressures.  . Myocardial infarction    2002, stent implanted   . Stroke [facility])    TIA 05/2011     PAST SURGICAL HISTORY:   Past Surgical History:  Procedure Laterality Date  . CARDIAC CATHETERIZATION    . CORONARY ANGIOPLASTY     stent 2002   . HERNIA REPAIR     bilateral   . TOTAL KNEE ARTHROPLASTY  09/12/2011   Procedure: TOTAL KNEE ARTHROPLASTY;  Surgeon: Johnn Hai, MD;  Location: [facility];  Service: Orthopedics;  Laterality: Left;    SOCIAL HISTORY:   Social History  Substance Use Topics  .  Smoking status: Former Smoker    Quit date: 08/25/1950  . Smokeless tobacco: Never Used  . Alcohol use No    FAMILY HISTORY:   Family History  Problem Relation Age of Onset  . COPD Father     DRUG ALLERGIES:   Allergies  Allergen Reactions  . Celebrex [Celecoxib]   . Vicodin [Hydrocodone-Acetaminophen] Other (See Comments)    NIGHTMARES     MEDICATIONS AT HOME:   Prior to Admission medications   Medication Sig Start Date End Date Taking? Authorizing Provider  amLODipine (NORVASC) 5 MG tablet Take 1 tablet (5 mg total) by mouth daily. 01/22/16  Yes Troy Sine, MD  aspirin EC 81 MG tablet Take 81 mg by mouth daily before breakfast.    Yes Historical Provider, MD  clopidogrel (PLAVIX) 75 MG tablet TAKE 1 TABLET DAILY 05/27/16  Yes Troy Sine, MD  guaiFENesin (MUCINEX) 600 MG 12 hr tablet Take by mouth.   Yes Historical Provider, MD  isosorbide mononitrate (IMDUR) 30 MG 24 hr tablet TAKE 1 TABLET DAILY BEFORE BREAKFAST 02/27/16  Yes Troy Sine, MD  Magnesium 500 MG CAPS Take 1 capsule by mouth daily.    Yes Historical Provider, MD  metoprolol succinate (TOPROL-XL) 25 MG 24 hr tablet TAKE ONE-HALF (1/2) TABLET DAILY 05/27/16  Yes Troy Sine, MD  minocycline (MINOCIN,DYNACIN) 100 MG capsule Take 100 mg by mouth 2 (two) times daily.  Yes Historical Provider, MD  Multiple Vitamin (MULITIVITAMIN WITH MINERALS) TABS Take 1 tablet by mouth daily.    Yes Historical Provider, MD  Omega-3 Fatty Acids (FISH OIL) 1200 MG CAPS Take 2 capsules by mouth daily.   Yes Historical Provider, MD  ramipril (ALTACE) 10 MG capsule Take 1 capsule (10 mg total) by mouth daily. 01/22/16  Yes Troy Sine, MD  rosuvastatin (CRESTOR) 40 MG tablet TAKE 1 TABLET DAILY 02/20/16  Yes Troy Sine, MD  naproxen sodium (ANAPROX) 220 MG tablet Take 440 mg by mouth 2 (two) times daily as needed. PAIN     Historical Provider, MD    REVIEW OF SYSTEMS:  Review of Systems  Constitutional: Negative for  chills, fever, malaise/fatigue and weight loss.  HENT: Negative for ear pain, hearing loss and tinnitus.   Eyes: Negative for blurred vision, double vision, pain and redness.  Respiratory: Positive for shortness of breath. Negative for cough and hemoptysis.   Cardiovascular: Negative for chest pain, palpitations, orthopnea and leg swelling.  Gastrointestinal: Negative for abdominal pain, constipation, diarrhea, nausea and vomiting.  Genitourinary: Negative for dysuria, frequency and hematuria.  Musculoskeletal: Negative for back pain, joint pain and neck pain.  Skin:       No acne, rash, or lesions  Neurological: Positive for weakness. Negative for dizziness, tremors and focal weakness.  Endo/Heme/Allergies: Negative for polydipsia. Does not bruise/bleed easily.  Psychiatric/Behavioral: Negative for depression. The patient is not nervous/anxious and does not have insomnia.      VITAL SIGNS:   Vitals:   09/03/16 1146 09/03/16 1314 09/03/16 2032 09/03/16 2259  BP: (!) 97/43 (!) 104/52  (!) 120/57  Pulse: 67 68  63  Resp: 18 16  17   Temp: 99.3 F (37.4 C)  (!) 100.8 F (38.2 C)   TempSrc: Oral  Oral   SpO2: 94% 92%  92%  Weight: 79.8 kg (176 lb)     Height: 5\' 6"  (1.676 m)      Wt Readings from Last 3 Encounters:  09/03/16 79.8 kg (176 lb)  05/11/16 80.3 kg (177 lb)  01/22/16 81.2 kg (179 lb)    PHYSICAL EXAMINATION:  Physical Exam  Vitals reviewed. Constitutional: He is oriented to person, place, and time. He appears well-developed and well-nourished. No distress.  HENT:  Head: Normocephalic and atraumatic.  Mouth/Throat: Oropharynx is clear and moist.  Eyes: Conjunctivae and EOM are normal. Pupils are equal, round, and reactive to light. No scleral icterus.  Neck: Normal range of motion. Neck supple. No JVD present. No thyromegaly present.  Cardiovascular: Normal rate, regular rhythm and intact distal pulses.  Exam reveals no gallop and no friction rub.   Murmur (2/6  systolic murmur) heard. Respiratory: Effort normal. No respiratory distress. He has no wheezes. He has no rales.  Right mid lung rhonchi  GI: Soft. Bowel sounds are normal. He exhibits no distension. There is no tenderness.  Musculoskeletal: Normal range of motion. He exhibits no edema.  No arthritis, no gout  Lymphadenopathy:    He has no cervical adenopathy.  Neurological: He is alert and oriented to person, place, and time. No cranial nerve deficit.  No dysarthria, no aphasia  Skin: Skin is warm and dry. No rash noted. No erythema.  Psychiatric: He has a normal mood and affect. His behavior is normal. Judgment and thought content normal.    LABORATORY PANEL:   CBC  Recent Labs Lab 09/03/16 1145  WBC 8.0  HGB 12.8*  HCT 37.2*  PLT 192   ------------------------------------------------------------------------------------------------------------------  Chemistries   Recent Labs Lab 09/03/16 1145  NA 130*  K 4.6  CL 96*  CO2 24  GLUCOSE 212*  BUN 18  CREATININE 1.15  CALCIUM 8.7*   ------------------------------------------------------------------------------------------------------------------  Cardiac Enzymes No results for input(s): TROPONINI in the last 168 hours. ------------------------------------------------------------------------------------------------------------------  RADIOLOGY:  Dg Chest 2 View  Result Date: 09/03/2016 CLINICAL DATA:  Pneumonia.  Former smoker. EXAM: CHEST  2 VIEW COMPARISON:  09/04/2011 FINDINGS: Atherosclerotic aortic arch.  Coronary artery stent. Indistinctly marginated 2.2 by 1.3 cm density in the right mid lung on the frontal projection, poorly correlated on the lateral projection but likely more posterior in location. The lungs appear otherwise clear. No pleural effusion. Thoracic spondylosis. IMPRESSION: 1. Indistinct 2.2 by 1.3 cm nodular density in the right mid lung. This could represent focal pneumonia or lung cancer.  Further workup options include CT of the chest (with contrast if feasible), or suitable antibiotic therapy with mandatory follow up imaging to confirm complete resolution of this density. 2. Atherosclerosis. Electronically Signed   By: Van Clines M.D.   On: 09/03/2016 12:46    EKG:   Orders placed or performed during the hospital encounter of 09/03/16  . ED EKG  . ED EKG  . EKG 12-Lead  . EKG 12-Lead    IMPRESSION AND PLAN:  Principal Problem:   CAP (community acquired pneumonia) - IV antibiotics, sputum culture, when necessary nebs and anti-tussive Active Problems:   Influenza A - Tamiflu   CAD (coronary artery disease) - continue home meds   Essential hypertension - continue home meds   Hyperlipidemia with target LDL less than 70 - home dose anti-lipids  All the records are reviewed and case discussed with ED provider. Management plans discussed with the patient and/or family.  DVT PROPHYLAXIS: SubQ lovenox  GI PROPHYLAXIS: None  ADMISSION STATUS: Inpatient  CODE STATUS: Full Code Status History    This patient does not have a recorded code status. Please follow your organizational policy for patients in this situation.      TOTAL TIME TAKING CARE OF THIS PATIENT: 45 minutes.    Caldwell Kronenberger [place] 09/04/2016, 12:02 AM  [facility]  Office  [phone]  CC: Primary care physician; Nathaneil Canary, PA-C

## 2016-09-04 NOTE — Discharge Summary (Signed)
[place] [facility] [place] NAME: [name] [name]    MR#:  [id number]  DATE OF BIRTH:  [birth year]  DATE OF ADMISSION:  09/03/2016 ADMITTING PHYSICIAN: Lance Coon, MD  DATE OF DISCHARGE: 09/04/2016  PRIMARY CARE PHYSICIAN: Nathaneil Canary, PA-C    ADMISSION DIAGNOSIS:  Influenza A [J10.1] Community acquired pneumonia of right lower lobe of lung ([place]) [J18.1]  DISCHARGE DIAGNOSIS:  Principal Problem:   CAP (community acquired pneumonia) Active Problems:   CAD (coronary artery disease)   Hyperlipidemia with target LDL less than 70   Essential hypertension   Influenza A   SECONDARY DIAGNOSIS:   Past Medical History:  Diagnosis Date  . Arthritis    arthritis in knees   . Cancer (HCC)    hx of basal cell carcinoma   . Coronary artery disease 07/30/11   Nuclear stress test-post-test EF is 45% Lowrisk scan . when compard to previouc study there is no significant change.  Marland Kitchen GERD (gastroesophageal reflux disease)   . Hypertension 09/19/10   ECHO-EF53% The transmitral spectral doppler flow pattern suggestive of impairded LV relaxation--likely normal for age with no evidence of ^ LA pressures or LV filling pressures. IVC is dilated, measureing 2.2cm, but unable to assess respirophasic variation.The dilated IVC would suggest ^ CVP/RAP of ~ 10-15 mmHg. RVSP is estimated at 25-35 mmHg consistant with borderline elevated pressures.  . Myocardial infarction    2002, stent implanted   . Stroke [facility])    TIA 05/2011     HOSPITAL COURSE:   83 y/o male with HTN and CAD here with Influenza A positive test.  1. Influenza A: Patient will continue treatment with Tamiflu for 5 days. He says he will obtain flu vaccination next year.  2. History of CAD: Continue aspirin, Plavix, isosorbide, metoprolol, Altase and Crestor  3. Essential hypertension on Norvasc isosorbide, metoprolol and Altase 4. History of CVA: Continue Plavix and statin  5. Recent  pneumonia: I think Levaquin is a better antibiotic and minocycline which he will be discharged with. He needs repeat chest x-ray in 3 weeks. DISCHARGE CONDITIONS AND DIET:   Stable for discharge on heart healthy diet  CONSULTS OBTAINED:    DRUG ALLERGIES:   Allergies  Allergen Reactions  . Celebrex [Celecoxib]   . Vicodin [Hydrocodone-Acetaminophen] Other (See Comments)    NIGHTMARES     DISCHARGE MEDICATIONS:   Current Discharge Medication List    START taking these medications   Details  benzonatate (TESSALON) 200 MG capsule Take 1 capsule (200 mg total) by mouth 3 (three) times daily as needed for cough. Qty: 20 capsule, Refills: 0    levofloxacin (LEVAQUIN) 750 MG tablet Take 1 tablet (750 mg total) by mouth daily. Qty: 5 tablet, Refills: 0    oseltamivir (TAMIFLU) 30 MG capsule Take 1 capsule (30 mg total) by mouth 2 (two) times daily. Qty: 8 capsule, Refills: 0      CONTINUE these medications which have NOT CHANGED   Details  amLODipine (NORVASC) 5 MG tablet Take 1 tablet (5 mg total) by mouth daily. Qty: 90 tablet, Refills: 3    aspirin EC 81 MG tablet Take 81 mg by mouth daily before breakfast.     clopidogrel (PLAVIX) 75 MG tablet TAKE 1 TABLET DAILY Qty: 90 tablet, Refills: 3    guaiFENesin (MUCINEX) 600 MG 12 hr tablet Take by mouth.    isosorbide mononitrate (IMDUR) 30 MG 24 hr tablet TAKE 1 TABLET DAILY BEFORE BREAKFAST  Qty: 90 tablet, Refills: 2    Magnesium 500 MG CAPS Take 1 capsule by mouth daily.     metoprolol succinate (TOPROL-XL) 25 MG 24 hr tablet TAKE ONE-HALF (1/2) TABLET DAILY Qty: 45 tablet, Refills: 3    Multiple Vitamin (MULITIVITAMIN WITH MINERALS) TABS Take 1 tablet by mouth daily.     Omega-3 Fatty Acids (FISH OIL) 1200 MG CAPS Take 2 capsules by mouth daily.    ramipril (ALTACE) 10 MG capsule Take 1 capsule (10 mg total) by mouth daily. Qty: 90 capsule, Refills: 3    rosuvastatin (CRESTOR) 40 MG tablet TAKE 1 TABLET  DAILY Qty: 90 tablet, Refills: 3    naproxen sodium (ANAPROX) 220 MG tablet Take 440 mg by mouth 2 (two) times daily as needed. PAIN       STOP taking these medications     minocycline (MINOCIN,DYNACIN) 100 MG capsule               Today   CHIEF COMPLAINT:  Patient doing very well this morning. No weakness. Reports a cough but no fevers or shortness of breath   VITAL SIGNS:  Blood pressure (!) 125/58, pulse 66, temperature 100 F (37.8 C), temperature source Oral, resp. rate 18, height 5\' 6"  (1.676 m), weight 78.9 kg (174 lb), SpO2 94 %.   REVIEW OF SYSTEMS:  Review of Systems  Constitutional: Negative.  Negative for chills, fever and malaise/fatigue.  HENT: Negative.  Negative for ear discharge, ear pain, hearing loss, nosebleeds and sore throat.   Eyes: Negative.  Negative for blurred vision and pain.  Respiratory: Positive for cough. Negative for hemoptysis, shortness of breath and wheezing.   Cardiovascular: Negative.  Negative for chest pain, palpitations and leg swelling.  Gastrointestinal: Negative.  Negative for abdominal pain, blood in stool, diarrhea, nausea and vomiting.  Genitourinary: Negative.  Negative for dysuria.  Musculoskeletal: Negative.  Negative for back pain.  Skin: Negative.   Neurological: Negative for dizziness, tremors, speech change, focal weakness, seizures and headaches.  Endo/Heme/Allergies: Negative.  Does not bruise/bleed easily.  Psychiatric/Behavioral: Negative.  Negative for depression, hallucinations and suicidal ideas.     PHYSICAL EXAMINATION:  GENERAL:  83 y.o.-year-old patient lying in the bed with no acute distress.  NECK:  Supple, no jugular venous distention. No thyroid enlargement, no tenderness.  LUNGS: Normal breath sounds bilaterally, no wheezing, rales,rhonchi  No use of accessory muscles of respiration.  CARDIOVASCULAR: S1, S2 normal. No murmurs, rubs, or gallops.  ABDOMEN: Soft, non-tender, non-distended. Bowel  sounds present. No organomegaly or mass.  EXTREMITIES: No pedal edema, cyanosis, or clubbing.  PSYCHIATRIC: The patient is alert and oriented x 3.  SKIN: No obvious rash, lesion, or ulcer.   DATA REVIEW:   CBC  Recent Labs Lab 09/04/16 0344  WBC 4.8  HGB 12.5*  HCT 36.3*  PLT 158    Chemistries   Recent Labs Lab 09/04/16 0344  NA 132*  K 4.0  CL 97*  CO2 28  GLUCOSE 160*  BUN 19  CREATININE 1.12  CALCIUM 8.4*    Cardiac Enzymes No results for input(s): TROPONINI in the last 168 hours.  Microbiology Results  @[email or web address] @  RADIOLOGY:  Dg Chest 2 View  Result Date: 09/03/2016 CLINICAL DATA:  Pneumonia.  Former smoker. EXAM: CHEST  2 VIEW COMPARISON:  09/04/2011 FINDINGS: Atherosclerotic aortic arch.  Coronary artery stent. Indistinctly marginated 2.2 by 1.3 cm density in the right mid lung on the frontal projection, poorly correlated on the lateral projection but  likely more posterior in location. The lungs appear otherwise clear. No pleural effusion. Thoracic spondylosis. IMPRESSION: 1. Indistinct 2.2 by 1.3 cm nodular density in the right mid lung. This could represent focal pneumonia or lung cancer. Further workup options include CT of the chest (with contrast if feasible), or suitable antibiotic therapy with mandatory follow up imaging to confirm complete resolution of this density. 2. Atherosclerosis. Electronically Signed   By: Van Clines M.D.   On: 09/03/2016 12:46      Management plans discussed with the patient and he is in agreement. Stable for discharge home  Patient should follow up with pcp  CODE STATUS:     Code Status Orders        Start     Ordered   09/04/16 0150  Full code  Continuous     09/04/16 0149    Code Status History    Date Active Date Inactive Code Status Order ID Comments User Context   This patient has a current code status but no historical code status.    Advance Directive Documentation   Flowsheet Row Most  Recent Value  Type of Advance Directive  Healthcare Power of Attorney  Pre-existing out of facility DNR order (yellow form or pink MOST form)  No data  "MOST" Form in Place?  No data      TOTAL TIME TAKING CARE OF THIS PATIENT: 36 minutes.    Note: This dictation was prepared with Dragon dictation along with smaller phrase technology. Any transcriptional errors that result from this process are unintentional.  Malahki Gasaway M.D on 09/04/2016 at 10:08 AM  Between 7am to 6pm - Pager - [phone] After 6pm go to [email or web address] - Proofreader  [facility]  Office  [phone]  CC: Primary care physician; Nathaneil Canary, PA-C

## 2016-09-04 NOTE — Care Management Important Message (Signed)
Important Message  Patient Details  Name: KALONJI TOMASSETTI MRN: [id number] Date of Birth: [birth year]   Medicare Important Message Given:  Yes    Jolly Mango, RN 09/04/2016, 10:09 AM

## 2016-09-04 NOTE — ED Notes (Signed)
Patient ambulatory to restroom with one assist without difficulty or increased shortness of breath. Patient states he "feels a little wobbly", but otherwise tolerated well. No other needs at this time.

## 2016-09-08 LAB — CULTURE, BLOOD (ROUTINE X 2): Culture: NO GROWTH

## 2016-09-09 LAB — CULTURE, BLOOD (ROUTINE X 2): Culture: NO GROWTH

## 2016-11-23 ENCOUNTER — Other Ambulatory Visit: Payer: Self-pay | Admitting: Cardiovascular Disease

## 2016-12-04 ENCOUNTER — Other Ambulatory Visit: Payer: Self-pay | Admitting: Cardiovascular Disease

## 2016-12-04 NOTE — Telephone Encounter (Signed)
NEEDS TO SCHEDULE APPT 

## 2017-01-05 ENCOUNTER — Other Ambulatory Visit: Payer: Self-pay | Admitting: Cardiovascular Disease

## 2017-01-06 NOTE — Telephone Encounter (Signed)
Rx(s) sent to pharmacy electronically.  

## 2017-01-17 ENCOUNTER — Other Ambulatory Visit: Payer: Self-pay | Admitting: Cardiovascular Disease

## 2017-02-14 ENCOUNTER — Other Ambulatory Visit: Payer: Self-pay | Admitting: Cardiovascular Disease

## 2017-03-22 ENCOUNTER — Other Ambulatory Visit: Payer: Self-pay | Admitting: Cardiovascular Disease

## 2017-03-23 ENCOUNTER — Telehealth: Payer: Self-pay | Admitting: Cardiovascular Disease

## 2017-03-23 MED ORDER — ROSUVASTATIN CALCIUM 40 MG PO TABS
40.0000 mg | ORAL_TABLET | Freq: Every day | ORAL | 0 refills | Status: DC
Start: 2017-03-23 — End: 2017-04-09

## 2017-03-23 NOTE — Telephone Encounter (Signed)
New Message      *STAT* If patient is at the pharmacy, call can be transferred to refill team.   1. Which medications need to be refilled? (please list name of each medication and dose if known)  rosuvastatin (CRESTOR) 40 MG tablet TAKE 1 TABLET DAILY     2. Which pharmacy/location (including street and city if local pharmacy) is medication to be sent to? [facility]   3. Do they need a 30 day or 90 day supply?  [name]

## 2017-03-23 NOTE — Telephone Encounter (Signed)
Rx has been sent to the pharmacy electronically. ° °

## 2017-04-07 ENCOUNTER — Other Ambulatory Visit: Payer: Self-pay | Admitting: Cardiovascular Disease

## 2017-04-09 ENCOUNTER — Ambulatory Visit (INDEPENDENT_AMBULATORY_CARE_PROVIDER_SITE_OTHER): Payer: Medicare HMO | Admitting: Physician Assistant

## 2017-04-09 ENCOUNTER — Encounter: Payer: Self-pay | Admitting: Physician Assistant

## 2017-04-09 VITALS — BP 112/68 | HR 52 | Ht 66.0 in | Wt 174.6 lb

## 2017-04-09 DIAGNOSIS — G459 Transient cerebral ischemic attack, unspecified: Secondary | ICD-10-CM

## 2017-04-09 DIAGNOSIS — R001 Bradycardia, unspecified: Secondary | ICD-10-CM | POA: Diagnosis not present

## 2017-04-09 DIAGNOSIS — E785 Hyperlipidemia, unspecified: Secondary | ICD-10-CM

## 2017-04-09 DIAGNOSIS — I10 Essential (primary) hypertension: Secondary | ICD-10-CM | POA: Diagnosis not present

## 2017-04-09 DIAGNOSIS — I2510 Atherosclerotic heart disease of native coronary artery without angina pectoris: Secondary | ICD-10-CM | POA: Diagnosis not present

## 2017-04-09 DIAGNOSIS — I2583 Coronary atherosclerosis due to lipid rich plaque: Secondary | ICD-10-CM

## 2017-04-09 MED ORDER — ROSUVASTATIN CALCIUM 40 MG PO TABS: 40.0000 mg | ORAL_TABLET | Freq: Every day | ORAL | 3 refills | Status: DC

## 2017-04-09 MED ORDER — ISOSORBIDE MONONITRATE ER 30 MG PO TB24: ORAL_TABLET | ORAL | 3 refills | Status: DC

## 2017-04-09 MED ORDER — RAMIPRIL 10 MG PO CAPS: ORAL_CAPSULE | ORAL | 3 refills | Status: DC

## 2017-04-09 MED ORDER — METOPROLOL SUCCINATE ER 25 MG PO TB24: 12.5000 mg | ORAL_TABLET | Freq: Every day | ORAL | 3 refills | Status: DC

## 2017-04-09 MED ORDER — CLOPIDOGREL BISULFATE 75 MG PO TABS: 75.0000 mg | ORAL_TABLET | Freq: Every day | ORAL | 3 refills | Status: DC

## 2017-04-09 NOTE — Patient Instructions (Addendum)
Medication Instructions:  Your physician recommends that you continue on your current medications as directed. Please refer to the Current Medication list given to you today. If you need a refill on your cardiac medications before your next appointment, please call your pharmacy.  Labwork: Fasting lipid pan and liver panel with PCP HERE IN OUR OFFICE AT [facility] PLEASE FAX RESULTS TO [phone]  Follow-Up: Your physician wants you to follow-up in: 12 months WITH DR Claiborne Billings You should receive a reminder letter in the mail two months in advance. If you do not receive a letter, please call our office MAY 2019 to schedule the AUGUST 2019 follow-up appointment.   Special Instructions: YOU ARE DOING GREAT!!  Thank you for choosing [facility] at [facility]!!

## 2017-04-09 NOTE — Progress Notes (Signed)
Cardiology Office Note    Date:  04/11/2017   ID:  NEZAR BUCKLES, DOB [birth year], MRN [id number]  PCP:  Nathaneil Canary, PA-C  Cardiologist:  Dr. Claiborne Billings   Chief Complaint  Patient presents with  . Follow-up    seen for Dr. Claiborne Billings    History of Present Illness:  [name] [name] is a 83 y.o. male with PMH of CAD s/p anterior MI 12/2000, GERD, HTN, HLD, and TIA 2012. Patient suffered an anterior MI in May 2002, he underwent cardiac catheterization with thrombectomy and stenting of proximal LAD occlusion with excellent reperfusion with the less than 2 hours after symptom onset. He had significant myocardial salvage and only small area of residual scarring in the anteroapical, septal and apical wall. Last Myoview in December 2012 showed small region of distal LAD scar without ischemia. He was last seen by Dr. Claiborne Billings on 01/22/2016, at which time he was doing well. He also has some history of bradycardia and first degree AV block, Toprol-XL was reduced to 12.5 mg daily.  Patient presents today for 1 year follow-up. He has been doing well other than some degree of hematuria after a bladder procedure. He is returning to see his urologist regarding this. Otherwise, for a 83 year old gentleman, he is quite active and would fix his entire floor by himself. He denies ever having any recent exertional chest discomfort or shortness of breath. He denies any obvious lower extremity edema, orthopnea or PND. He can follow-up in one year. He will need a fasting lipid panel and a liver function test, this can be done at primary care physician's office.    Past Medical History:  Diagnosis Date  . Arthritis    arthritis in knees   . Cancer (HCC)    hx of basal cell carcinoma   . Coronary artery disease 07/30/11   Nuclear stress test-post-test EF is 45% Lowrisk scan . when compard to previouc study there is no significant change.  Marland Kitchen GERD (gastroesophageal reflux disease)   . Hypertension 09/19/10   ECHO-EF53% The transmitral spectral doppler flow pattern suggestive of impairded LV relaxation--likely normal for age with no evidence of ^ LA pressures or LV filling pressures. IVC is dilated, measureing 2.2cm, but unable to assess respirophasic variation.The dilated IVC would suggest ^ CVP/RAP of ~ 10-15 mmHg. RVSP is estimated at 25-35 mmHg consistant with borderline elevated pressures.  . Myocardial infarction ([place])    2002, stent implanted   . Stroke [facility])    TIA 05/2011     Past Surgical History:  Procedure Laterality Date  . CARDIAC CATHETERIZATION    . CORONARY ANGIOPLASTY     stent 2002   . HERNIA REPAIR     bilateral   . TOTAL KNEE ARTHROPLASTY  09/12/2011   Procedure: TOTAL KNEE ARTHROPLASTY;  Surgeon: Johnn Hai, MD;  Location: [facility];  Service: Orthopedics;  Laterality: Left;    Current Medications: Outpatient Medications Prior to Visit  Medication Sig Dispense Refill  . amLODipine (NORVASC) 5 MG tablet TAKE 1 TABLET DAILY (MAKE APPOINTMENT FOR FURTHER REFILLS) 60 tablet 0  . aspirin EC 81 MG tablet Take 81 mg by mouth daily before breakfast.     . levofloxacin (LEVAQUIN) 750 MG tablet Take 1 tablet (750 mg total) by mouth daily. 5 tablet 0  . Magnesium 500 MG CAPS Take 1 capsule by mouth daily.     . Multiple Vitamin (MULITIVITAMIN WITH MINERALS) TABS Take 1 tablet by mouth daily.     Marland Kitchen  benzonatate (TESSALON) 200 MG capsule Take 1 capsule (200 mg total) by mouth 3 (three) times daily as needed for cough. 20 capsule 0  . clopidogrel (PLAVIX) 75 MG tablet TAKE 1 TABLET DAILY 90 tablet 3  . guaiFENesin (MUCINEX) 600 MG 12 hr tablet Take by mouth.    . isosorbide mononitrate (IMDUR) 30 MG 24 hr tablet TAKE 1 TABLET DAILY (PLEASE CONTACT OFFICE FOR ADDITIONAL REFILLS FINAL WARNING, NO FURTHER REFILLS) 90 tablet 0  . metoprolol succinate (TOPROL-XL) 25 MG 24 hr tablet TAKE ONE-HALF (1/2) TABLET DAILY 45 tablet 3  . naproxen sodium (ANAPROX) 220 MG tablet Take 440 mg by  mouth 2 (two) times daily as needed. PAIN     . Omega-3 Fatty Acids (FISH OIL) 1200 MG CAPS Take 2 capsules by mouth daily.    . ramipril (ALTACE) 10 MG capsule TAKE 1 CAPSULE DAILY (MAKE APPOINTMENT FOR FURTHER REFILLS) 60 capsule 0  . rosuvastatin (CRESTOR) 40 MG tablet Take 1 tablet (40 mg total) by mouth daily. 30 tablet 0   No facility-administered medications prior to visit.      Allergies:   Celebrex [celecoxib]; Hydrocodone; Hydrocodone-acetaminophen; and Vicodin [hydrocodone-acetaminophen]   Social History   Social History  . Marital status: Single    Spouse name: N/A  . Number of children: N/A  . Years of education: N/A   Social History Main Topics  . Smoking status: Former Smoker    Quit date: 08/25/1950  . Smokeless tobacco: Never Used  . Alcohol use No  . Drug use: No  . Sexual activity: Not Asked   Other Topics Concern  . None   Social History Narrative  . None     Family History:  The patient's family history includes COPD in his father.   ROS:   Please see the history of present illness.    ROS All other systems reviewed and are negative.   PHYSICAL EXAM:   VS:  BP 112/68   Pulse (!) 52   Ht 5\' 6"  (1.676 m)   Wt 174 lb 9.6 oz (79.2 kg)   BMI 28.18 kg/m    GEN: Well nourished, well developed, in no acute distress  HEENT: normal  Neck: no JVD, carotid bruits, or masses Cardiac: RRR; no murmurs, rubs, or gallops,no edema  Respiratory:  clear to auscultation bilaterally, normal work of breathing GI: soft, nontender, nondistended, + BS MS: no deformity or atrophy  Skin: warm and dry, no rash Neuro:  Alert and Oriented x 3, Strength and sensation are intact Psych: euthymic mood, full affect  Wt Readings from Last 3 Encounters:  04/09/17 174 lb 9.6 oz (79.2 kg)  09/04/16 174 lb (78.9 kg)  05/11/16 177 lb (80.3 kg)      Studies/Labs Reviewed:   EKG:  EKG is ordered today.  The ekg ordered today demonstrates Sinus bradycardia, heart rate 57,  right bundle branch block. When compared to the previous EKG no obvious changes.  Recent Labs: 09/04/2016: BUN 19; Creatinine, Ser 1.12; Hemoglobin 12.5; Platelets 158; Potassium 4.0; Sodium 132   Lipid Panel    Component Value Date/Time   CHOL 108 (L) 01/22/2016 1056   TRIG 180 (H) 01/22/2016 1056   HDL 37 (L) 01/22/2016 1056   CHOLHDL 2.9 01/22/2016 1056   VLDL 36 (H) 01/22/2016 1056   LDLCALC 35 01/22/2016 1056    Additional studies/ records that were reviewed today include:   Myoview 07/30/2011    Echo 2012   ASSESSMENT:  1. Coronary artery disease due to lipid rich plaque   2. Bradycardia   3. Essential hypertension   4. Hyperlipidemia, unspecified hyperlipidemia type   5. Transient cerebral ischemia, unspecified type      PLAN:  In order of problems listed above:  1. CAD: Denies any chest discomfort or shortness of breath, we'll continue on current medication. Continue on aspirin and Plavix  2. Hypertension: Continue on metoprolol, Imdur and ramipril. Blood pressure well-controlled. Mildly bradycardic in the 50s, unchanged compared to the previous office visit.  3. Hyperlipidemia: On Crestor 40 mg daily. Will obtain fasting lipid panel and LFTs at primary care physician's office.  4. History of TIA: No recent neurological symptoms.    Medication Adjustments/Labs and Tests Ordered: Current medicines are reviewed at length with the patient today.  Concerns regarding medicines are outlined above.  Medication changes, Labs and Tests ordered today are listed in the Patient Instructions below. Patient Instructions  Medication Instructions:  Your physician recommends that you continue on your current medications as directed. Please refer to the Current Medication list given to you today. If you need a refill on your cardiac medications before your next appointment, please call your pharmacy.  Labwork: Fasting lipid pan and liver panel with PCP HERE IN OUR OFFICE  AT [facility] PLEASE FAX RESULTS TO [phone]  Follow-Up: Your physician wants you to follow-up in: 12 months WITH DR Claiborne Billings You should receive a reminder letter in the mail two months in advance. If you do not receive a letter, please call our office MAY 2019 to schedule the AUGUST 2019 follow-up appointment.   Special Instructions: YOU ARE DOING GREAT!!  Thank you for choosing [facility] at [employer], [place]  04/11/2017 7:58 AM    [address], [place], [place]  [address] Phone: [phone]; Fax: [phone]

## 2017-04-11 ENCOUNTER — Encounter: Payer: Self-pay | Admitting: Physician Assistant

## 2017-04-19 ENCOUNTER — Other Ambulatory Visit: Payer: Self-pay | Admitting: Cardiovascular Disease

## 2017-04-20 NOTE — Telephone Encounter (Signed)
REFILL 

## 2017-04-28 ENCOUNTER — Other Ambulatory Visit: Payer: Self-pay | Admitting: Cardiovascular Disease

## 2017-04-28 MED ORDER — METOPROLOL SUCCINATE ER 25 MG PO TB24: 12.5000 mg | ORAL_TABLET | Freq: Every day | ORAL | 3 refills | Status: DC

## 2017-04-28 MED ORDER — AMLODIPINE BESYLATE 5 MG PO TABS: 5.0000 mg | ORAL_TABLET | Freq: Every day | ORAL | 3 refills | Status: DC

## 2017-04-28 NOTE — Telephone Encounter (Signed)
Rx(s) sent to pharmacy electronically.  

## 2017-04-28 NOTE — Telephone Encounter (Signed)
°*  STAT* If patient is at the pharmacy, call can be transferred to refill team.   1. Which medications need to be refilled? (please list name of each medication and dose if known) Amlodipine( Norvasc) 5mg  ,   2. Which pharmacy/location (including street and city if local pharmacy) is medication to be sent to? Metoprolol Succinate(Toprol -XL 12.5mg  )   3. Do they need a 30 day or 90 day supply? 108  Mr. Marques would like for his prescriptions to be sent to [facility]

## 2017-05-19 ENCOUNTER — Other Ambulatory Visit: Payer: Self-pay | Admitting: Cardiovascular Disease

## 2017-05-20 ENCOUNTER — Other Ambulatory Visit: Payer: Self-pay | Admitting: Cardiovascular Disease

## 2017-07-05 ENCOUNTER — Other Ambulatory Visit: Payer: Self-pay | Admitting: Cardiovascular Disease

## 2017-07-06 NOTE — Telephone Encounter (Signed)
REFILL 

## 2017-08-21 ENCOUNTER — Telehealth: Payer: Self-pay | Admitting: Cardiovascular Disease

## 2017-08-21 MED ORDER — ROSUVASTATIN CALCIUM 40 MG PO TABS: 40.0000 mg | ORAL_TABLET | Freq: Every day | ORAL | 1 refills | Status: DC

## 2017-08-21 NOTE — Telephone Encounter (Signed)
Patient walked in to office requesting 90 day supply of crestor be sent to [facility]. Rx(s) sent to pharmacy electronically.

## 2018-01-15 ENCOUNTER — Other Ambulatory Visit: Payer: Self-pay | Admitting: Cardiovascular Disease

## 2018-01-15 NOTE — Telephone Encounter (Signed)
Rx request sent to pharmacy.  

## 2018-03-16 ENCOUNTER — Other Ambulatory Visit: Payer: Self-pay | Admitting: Cardiovascular Disease

## 2018-04-26 ENCOUNTER — Other Ambulatory Visit: Payer: Self-pay | Admitting: Cardiovascular Disease

## 2018-05-08 ENCOUNTER — Other Ambulatory Visit: Payer: Self-pay | Admitting: Cardiovascular Disease

## 2018-05-17 ENCOUNTER — Other Ambulatory Visit: Payer: Self-pay | Admitting: Cardiovascular Disease

## 2018-05-27 ENCOUNTER — Encounter: Payer: Self-pay | Admitting: Cardiology

## 2018-05-27 ENCOUNTER — Ambulatory Visit: Payer: Medicare HMO | Admitting: Cardiology

## 2018-05-27 VITALS — BP 120/60 | HR 56 | Ht 66.0 in | Wt 178.0 lb

## 2018-05-27 DIAGNOSIS — I2583 Coronary atherosclerosis due to lipid rich plaque: Secondary | ICD-10-CM | POA: Diagnosis not present

## 2018-05-27 DIAGNOSIS — I10 Essential (primary) hypertension: Secondary | ICD-10-CM | POA: Diagnosis not present

## 2018-05-27 DIAGNOSIS — I2510 Atherosclerotic heart disease of native coronary artery without angina pectoris: Secondary | ICD-10-CM

## 2018-05-27 DIAGNOSIS — M1710 Unilateral primary osteoarthritis, unspecified knee: Secondary | ICD-10-CM

## 2018-05-27 DIAGNOSIS — E119 Type 2 diabetes mellitus without complications: Secondary | ICD-10-CM

## 2018-05-27 DIAGNOSIS — E785 Hyperlipidemia, unspecified: Secondary | ICD-10-CM

## 2018-05-27 DIAGNOSIS — M179 Osteoarthritis of knee, unspecified: Secondary | ICD-10-CM

## 2018-05-27 DIAGNOSIS — M1712 Unilateral primary osteoarthritis, left knee: Secondary | ICD-10-CM

## 2018-05-27 DIAGNOSIS — I4510 Unspecified right bundle-branch block: Secondary | ICD-10-CM

## 2018-05-27 DIAGNOSIS — Z9861 Coronary angioplasty status: Secondary | ICD-10-CM

## 2018-05-27 HISTORY — DX: Unilateral primary osteoarthritis, unspecified knee: M17.10

## 2018-05-27 HISTORY — DX: Osteoarthritis of knee, unspecified: M17.9

## 2018-05-27 HISTORY — DX: Type 2 diabetes mellitus without complications: E11.9

## 2018-05-27 NOTE — Assessment & Plan Note (Signed)
RBBB LAFB, borderline asymptomatic bradycardia would cut his beta blocker if he has any weakness or dizziness.

## 2018-05-27 NOTE — Progress Notes (Signed)
05/27/2018 [name] [name]   Jun 26, 1921  [id number]  Primary Physician Nathaneil Canary, PA-C Primary Cardiologist: Dr Claiborne Billings  HPI:  Remarkable 84 y/o male, [place] [employer] in [place],  followed by Dr Claiborne Billings with a history of a remote [place] Day weekend in 2002. He was treated with thrombectomy and stenting of his LAD. His last Myoview was in 2012 before his Lt knee replacement. He has done well since, he works full time doing maintenance and [employer]. He is in the office today for one year follow up. He is also followed by Dr Kary Kos at [facility] who follows his lab work.    Current Outpatient Medications  Medication Sig Dispense Refill  . amLODipine (NORVASC) 5 MG tablet Take 1 tablet (5 mg total) by mouth daily. NEED OV. 90 tablet 0  . aspirin EC 81 MG tablet Take 81 mg by mouth daily before breakfast.     . clopidogrel (PLAVIX) 75 MG tablet TAKE 1 TABLET DAILY 90 tablet 0  . isosorbide mononitrate (IMDUR) 30 MG 24 hr tablet TAKE 1 TABLET DAILY (PLEASE CONTACT OFFICE FOR ADDITIONAL REFILLS FINAL WARNING, NO FURTHER REFILLS) 90 tablet 3  . Magnesium 500 MG CAPS Take 1 capsule by mouth daily.     . metoprolol succinate (TOPROL-XL) 25 MG 24 hr tablet Take 0.5 tablets (12.5 mg total) by mouth daily. NEED OV. 45 tablet 0  . Multiple Vitamin (MULITIVITAMIN WITH MINERALS) TABS Take 1 tablet by mouth daily.     . ramipril (ALTACE) 10 MG capsule TAKE 1 CAPSULE DAILY (MAKE APPOINTMENT FOR FURTHER REFILLS) 60 capsule 1  . rosuvastatin (CRESTOR) 40 MG tablet Take 1 tablet (40 mg total) by mouth daily. Please schedule appointment for further refills 30 tablet 0   No current facility-administered medications for this visit.     Allergies  Allergen Reactions  . Celebrex [Celecoxib]     dizzy  . Hydrocodone     Other reaction(s): Other (See Comments) Nightmares  . Hydrocodone-Acetaminophen Other (See Comments)    NIGHTMARES  . Vicodin [Hydrocodone-Acetaminophen] Other (See  Comments)    NIGHTMARES     Past Medical History:  Diagnosis Date  . Arthritis    arthritis in knees   . Cancer (HCC)    hx of basal cell carcinoma   . Coronary artery disease 07/30/11   Nuclear stress test-post-test EF is 45% Lowrisk scan . when compard to previouc study there is no significant change.  Marland Kitchen GERD (gastroesophageal reflux disease)   . Hypertension 09/19/10   ECHO-EF53% The transmitral spectral doppler flow pattern suggestive of impairded LV relaxation--likely normal for age with no evidence of ^ LA pressures or LV filling pressures. IVC is dilated, measureing 2.2cm, but unable to assess respirophasic variation.The dilated IVC would suggest ^ CVP/RAP of ~ 10-15 mmHg. RVSP is estimated at 25-35 mmHg consistant with borderline elevated pressures.  . Myocardial infarction ([place])    2002, stent implanted   . Stroke [facility])    TIA 05/2011     Social History   Socioeconomic History  . Marital status: Single    Spouse name: Not on file  . Number of children: Not on file  . Years of education: Not on file  . Highest education level: Not on file  Occupational History  . Not on file  Social Needs  . Financial resource strain: Not on file  . Food insecurity:    Worry: Not on file    Inability: Not on file  .  Transportation needs:    Medical: Not on file    Non-medical: Not on file  Tobacco Use  . Smoking status: Former Smoker    Last attempt to quit: 08/25/1950    Years since quitting: 67.8  . Smokeless tobacco: Never Used  Substance and Sexual Activity  . Alcohol use: No  . Drug use: No  . Sexual activity: Not on file  Lifestyle  . Physical activity:    Days per week: Not on file    Minutes per session: Not on file  . Stress: Not on file  Relationships  . Social connections:    Talks on phone: Not on file    Gets together: Not on file    Attends religious service: Not on file    Active member of club or organization: Not on file    Attends meetings of clubs  or organizations: Not on file    Relationship status: Not on file  . Intimate partner violence:    Fear of current or ex partner: Not on file    Emotionally abused: Not on file    Physically abused: Not on file    Forced sexual activity: Not on file  Other Topics Concern  . Not on file  Social History Narrative  . Not on file     Family History  Problem Relation Age of Onset  . COPD Father      Review of Systems: General: negative for chills, fever, night sweats or weight changes.  Cardiovascular: negative for chest pain, dyspnea on exertion, edema, orthopnea, palpitations, paroxysmal nocturnal dyspnea or shortness of breath Dermatological: negative for rash Respiratory: negative for cough or wheezing Urologic: negative for hematuria Abdominal: negative for nausea, vomiting, diarrhea, bright red blood per rectum, melena, or hematemesis Neurologic: negative for visual changes, syncope, or dizziness He has Rt knee arthritis All other systems reviewed and are otherwise negative except as noted above.    Blood pressure 120/60, pulse (!) 56, height 5\' 6"  (1.676 m), weight 178 lb (80.7 kg), SpO2 95 %.  General appearance: alert, cooperative, no distress and looks younger than his stated age Lungs: clear to auscultation bilaterally Heart: regular rate and rhythm Extremities: no edema Skin: recent skin biopsy Lt cheek Neurologic: Grossly normal  EKG NSR, HR 60, RBBB, LAFB  ASSESSMENT AND PLAN:   CAD S/P percutaneous coronary angioplasty AWMI treated with LAD PCI-stent Memorial day 2002 Myioview low risk 2012 (pre op knee)  Right bundle branch block RBBB LAFB, borderline asymptomatic bradycardia would cut his beta blocker if he has any weakness or dizziness.   Hyperlipidemia with target LDL less than 70 On statin Rx  Essential hypertension Controlled  DJD (degenerative joint disease) of knee He asked if he was a candidate for knee replacement-followed by Dr  Maxie Better  Non-insulin dependent type 2 diabetes mellitus ([place]) Type 2 NIDDM- on Glucotrol   PLAN  Same Rx for now- consider decreasing his beta blocker if he develops any symptoms. I'll ask Dr Evette Georges opinion about knee replacement. The pt says currently he is OK but he is "bone on bone".   Kerin Ransom PA-C 05/27/2018 4:04 PM

## 2018-05-27 NOTE — Assessment & Plan Note (Signed)
Controlled.  

## 2018-05-27 NOTE — Assessment & Plan Note (Signed)
On statin Rx 

## 2018-05-27 NOTE — Assessment & Plan Note (Signed)
Type 2 NIDDM- on Glucotrol

## 2018-05-27 NOTE — Patient Instructions (Addendum)
Medication Instructions:  Your physician recommends that you continue on your current medications as directed. Please refer to the Current Medication list given to you today.  If you need a refill on your cardiac medications before your next appointment, please call your pharmacy.  Labwork: [place] in our office  Testing/Procedures: NONE   Follow-Up: Your physician wants you to follow-up in: [place]. You will receive a reminder letter in the mail two months in advance. If you don't receive a letter, please call our office to schedule the follow-up appointment.  Any Other Special Instructions Will Be Listed Below (If Applicable).

## 2018-05-27 NOTE — Assessment & Plan Note (Signed)
He asked if he was a candidate for knee replacement-followed by Dr Maxie Better

## 2018-05-27 NOTE — Assessment & Plan Note (Signed)
AWMI treated with LAD PCI-stent Memorial day 2002 Myioview low risk 2012 (pre op knee) 

## 2018-06-18 ENCOUNTER — Other Ambulatory Visit: Payer: Self-pay

## 2018-06-18 MED ORDER — RAMIPRIL 10 MG PO CAPS: ORAL_CAPSULE | ORAL | 3 refills | Status: DC

## 2018-06-18 MED ORDER — METOPROLOL SUCCINATE ER 25 MG PO TB24: 12.5000 mg | ORAL_TABLET | Freq: Every day | ORAL | 3 refills | Status: DC

## 2018-06-18 MED ORDER — ROSUVASTATIN CALCIUM 40 MG PO TABS: 40.0000 mg | ORAL_TABLET | Freq: Every day | ORAL | 3 refills | Status: DC

## 2018-06-18 MED ORDER — CLOPIDOGREL BISULFATE 75 MG PO TABS: 75.0000 mg | ORAL_TABLET | Freq: Every day | ORAL | 3 refills | Status: DC

## 2018-06-18 MED ORDER — AMLODIPINE BESYLATE 5 MG PO TABS: 5.0000 mg | ORAL_TABLET | Freq: Every day | ORAL | 3 refills | Status: DC

## 2018-06-18 MED ORDER — ISOSORBIDE MONONITRATE ER 30 MG PO TB24: ORAL_TABLET | ORAL | 3 refills | Status: DC

## 2019-06-13 ENCOUNTER — Other Ambulatory Visit: Payer: Self-pay

## 2019-06-13 ENCOUNTER — Encounter: Payer: Self-pay | Admitting: Cardiovascular Disease

## 2019-06-13 ENCOUNTER — Other Ambulatory Visit: Payer: Self-pay | Admitting: Cardiovascular Disease

## 2019-06-13 ENCOUNTER — Ambulatory Visit (INDEPENDENT_AMBULATORY_CARE_PROVIDER_SITE_OTHER): Payer: Medicare HMO | Admitting: Cardiovascular Disease

## 2019-06-13 DIAGNOSIS — Z9861 Coronary angioplasty status: Secondary | ICD-10-CM

## 2019-06-13 DIAGNOSIS — I440 Atrioventricular block, first degree: Secondary | ICD-10-CM | POA: Diagnosis not present

## 2019-06-13 DIAGNOSIS — E785 Hyperlipidemia, unspecified: Secondary | ICD-10-CM | POA: Diagnosis not present

## 2019-06-13 DIAGNOSIS — I452 Bifascicular block: Secondary | ICD-10-CM

## 2019-06-13 DIAGNOSIS — I2510 Atherosclerotic heart disease of native coronary artery without angina pectoris: Secondary | ICD-10-CM

## 2019-06-13 DIAGNOSIS — I10 Essential (primary) hypertension: Secondary | ICD-10-CM

## 2019-06-13 MED ORDER — ROSUVASTATIN CALCIUM 40 MG PO TABS: ORAL_TABLET | ORAL | 3 refills | Status: DC

## 2019-06-13 MED ORDER — AMLODIPINE BESYLATE 5 MG PO TABS: 5.0000 mg | ORAL_TABLET | Freq: Every day | ORAL | 3 refills | Status: DC

## 2019-06-13 MED ORDER — METOPROLOL SUCCINATE ER 25 MG PO TB24: 12.5000 mg | ORAL_TABLET | Freq: Every day | ORAL | 3 refills | Status: DC

## 2019-06-13 MED ORDER — CLOPIDOGREL BISULFATE 75 MG PO TABS: 75.0000 mg | ORAL_TABLET | Freq: Every day | ORAL | 3 refills | Status: DC

## 2019-06-13 MED ORDER — ISOSORBIDE MONONITRATE ER 30 MG PO TB24: 30.0000 mg | ORAL_TABLET | Freq: Every day | ORAL | 3 refills | Status: DC

## 2019-06-13 MED ORDER — RAMIPRIL 10 MG PO CAPS: ORAL_CAPSULE | ORAL | 3 refills | Status: DC

## 2019-06-13 NOTE — Progress Notes (Signed)
Patient ID: [name] [name], male   DOB: 05/31/1921, 85 y.o.   MRN: [id number]    Primary: Silvio Pate, PA-C  HPI: [name] [name] is a 85 y.o. male who presents to the office today for a greater than 3-year cardiology evaluation with me.  I last saw him in May 2017.  He was last seen in our practice in October 2019 by Kerin Ransom, PA-C.  [name] [name] has CAD and in May 2002 suffered an anterior wall myocardial infarction. He underwent  acute catheterization, thrombectomy, and stenting of a proximal LAD occlusion with excellent reperfusion time of less than 2 hours. He had significant myocardial salvage and only has a small area of residual scarring in the anteroapical, septal, apical walls. His last nuclear perfusion study in December 2012 which was done prior to knee replacement surgery only showed a small region of distal LAD scar without ischemia.  When I last saw him in May 2017 he continued to feel well and was remaining active working in housing maintenance.  He was taking Crestor 40 mg and omega-3 fatty acids for hyperlipidemia and Toprol XL 12.5 mg daily,  isosorbide mononitrate 30 mg in addition to amlodipine 5 mg, both for blood pressure and CAD.  He continued to be on dual antiplatelet therapy with aspirin and Plavix and denies bleeding.   Since I last saw him, he has continued to do exceptionally well.  He lives by himself since his wife died 24 years ago.  He continues to work several days per week and does at least 3-4 jobs on an individual basis doing maintenance at [employer].  Times he has noticed a transient dizziness most of the time he feels well.  He denies palpitations.  He denies recurrent anginal symptoms.  He had blood work done last week at the cardiology clinic by Dr. Elie Goody and will be seeing Dr. Elie Goody this week for the results.  He does have difficulty with his knees.  He presents for evaluation.  Past Medical History:  Diagnosis Date  . Arthritis    arthritis in  knees   . Cancer (HCC)    hx of basal cell carcinoma   . Coronary artery disease 07/30/11   Nuclear stress test-post-test EF is 45% Lowrisk scan . when compard to previouc study there is no significant change.  Marland Kitchen GERD (gastroesophageal reflux disease)   . Hypertension 09/19/10   ECHO-EF53% The transmitral spectral doppler flow pattern suggestive of impairded LV relaxation--likely normal for age with no evidence of ^ LA pressures or LV filling pressures. IVC is dilated, measureing 2.2cm, but unable to assess respirophasic variation.The dilated IVC would suggest ^ CVP/RAP of ~ 10-15 mmHg. RVSP is estimated at 25-35 mmHg consistant with borderline elevated pressures.  . Myocardial infarction ([place])    2002, stent implanted   . Stroke [facility])    TIA 05/2011     Past Surgical History:  Procedure Laterality Date  . CARDIAC CATHETERIZATION    . CORONARY ANGIOPLASTY     stent 2002   . HERNIA REPAIR     bilateral   . TOTAL KNEE ARTHROPLASTY  09/12/2011   Procedure: TOTAL KNEE ARTHROPLASTY;  Surgeon: Johnn Hai, MD;  Location: [facility];  Service: Orthopedics;  Laterality: Left;    Allergies  Allergen Reactions  . Celebrex [Celecoxib]     dizzy  . Hydrocodone     Other reaction(s): Other (See Comments) Nightmares  . Hydrocodone-Acetaminophen Other (See Comments)    NIGHTMARES  .  Vicodin [Hydrocodone-Acetaminophen] Other (See Comments)    NIGHTMARES     Current Outpatient Medications  Medication Sig Dispense Refill  . amLODipine (NORVASC) 5 MG tablet Take 1 tablet (5 mg total) by mouth daily. 90 tablet 3  . aspirin EC 81 MG tablet Take 81 mg by mouth daily before breakfast.     . clopidogrel (PLAVIX) 75 MG tablet Take 1 tablet (75 mg total) by mouth daily. 90 tablet 3  . glipiZIDE (GLUCOTROL XL) 5 MG 24 hr tablet Take 5 mg by mouth daily with breakfast.    . Magnesium 500 MG CAPS Take 1 capsule by mouth daily.     . meloxicam (MOBIC) 15 MG tablet TAKE 1 TABLET BY MOUTH EVERY DAY     . metoprolol succinate (TOPROL-XL) 25 MG 24 hr tablet Take 0.5 tablets (12.5 mg total) by mouth daily. 45 tablet 3  . Multiple Vitamin (MULITIVITAMIN WITH MINERALS) TABS Take 1 tablet by mouth daily.     . ramipril (ALTACE) 10 MG capsule TAKE 1 CAPSULE DAILY 90 capsule 3  . isosorbide mononitrate (IMDUR) 30 MG 24 hr tablet Take 1 tablet (30 mg total) by mouth daily. 90 tablet 3  . rosuvastatin (CRESTOR) 40 MG tablet TAKE 1 TABLET DAILY 90 tablet 3   No current facility-administered medications for this visit.     Social History   Socioeconomic History  . Marital status: Single    Spouse name: Not on file  . Number of children: Not on file  . Years of education: Not on file  . Highest education level: Not on file  Occupational History  . Not on file  Social Needs  . Financial resource strain: Not on file  . Food insecurity    Worry: Not on file    Inability: Not on file  . Transportation needs    Medical: Not on file    Non-medical: Not on file  Tobacco Use  . Smoking status: Former Smoker    Quit date: 08/25/1950    Years since quitting: 68.8  . Smokeless tobacco: Never Used  Substance and Sexual Activity  . Alcohol use: No  . Drug use: No  . Sexual activity: Not on file  Lifestyle  . Physical activity    Days per week: Not on file    Minutes per session: Not on file  . Stress: Not on file  Relationships  . Social Herbalist on phone: Not on file    Gets together: Not on file    Attends religious service: Not on file    Active member of club or organization: Not on file    Attends meetings of clubs or organizations: Not on file    Relationship status: Not on file  . Intimate partner violence    Fear of current or ex partner: Not on file    Emotionally abused: Not on file    Physically abused: Not on file    Forced sexual activity: Not on file  Other Topics Concern  . Not on file  Social History Narrative  . 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  Family history is notable for both parents and  all siblings are deceased.   ROS General: Negative; No fevers, chills, or night sweats;  HEENT: Negative; No changes in vision or hearing, sinus congestion, difficulty swallowing Pulmonary: Negative; No cough, wheezing, shortness of breath, hemoptysis Cardiovascular: Negative; No chest pain, presyncope, syncope, palpitations GI: Negative; No nausea, vomiting, diarrhea, or abdominal pain GU: Negative; No dysuria, hematuria, or difficulty voiding Musculoskeletal: Bilateral knee discomfort, status post left knee surgery Hematologic/Oncology: Negative; no easy bruising, bleeding Endocrine: Negative; no heat/cold intolerance; no diabetes Neuro: Negative; no changes in balance, headaches Skin: Negative; No rashes or skin lesions Psychiatric: Negative; No behavioral problems, depression Sleep: Negative; No snoring, daytime sleepiness, hypersomnolence, bruxism, restless legs, hypnogognic hallucinations, no cataplexy Other comprehensive 14 point system review is negative.  PE BP 133/76   Pulse 77   Temp 97.9 F (36.6 C)   Ht '5\' 6"'  (1.676 m)   Wt 182 lb (82.6 kg)   SpO2 97%   BMI 29.38 kg/m    Repeat blood pressure by me was 110/70 supine and 110/60 standing  Wt Readings from Last 3 Encounters:  06/13/19 182 lb (82.6 kg)  05/27/18 178 lb (80.7 kg)  04/09/17 174 lb 9.6 oz (79.2 kg)   General: Alert, oriented, no distress.  Skin: normal turgor, no rashes, warm and dry HEENT: Normocephalic, atraumatic. Pupils equal round and reactive to light; sclera anicteric; extraocular muscles intact;  Nose without nasal septal hypertrophy Mouth/Parynx benign; Mallinpatti scale 2 Neck: No  JVD, no carotid bruits; normal carotid upstroke Lungs: clear to ausculatation and percussion; no wheezing or rales Chest wall: without tenderness to palpitation Heart: PMI not displaced, RRR, s1 s2 normal, 1/6 systolic murmur, no diastolic murmur, no rubs, gallops, thrills, or heaves Abdomen: soft, nontender; no hepatosplenomehaly, BS+; abdominal aorta nontender and not dilated by palpation. Back: no CVA tenderness Pulses 2+ Musculoskeletal: full range of motion, normal strength, no joint deformities Extremities: no clubbing cyanosis or edema, Homan's sign negative  Neurologic: grossly nonfocal; Cranial nerves grossly wnl Psychologic: Normal mood and affect   ECG (independently read by me): Sinus rhythm at 66 bpm with first-degree block, right bundle branch block, and left anterior hemiblock.  Old inferior MI.  QTc interval 461 ms, PR interval 214 ms.  May 2017 ECG (independently read by me): Sinus bradycardia at 56 bpm with first-degree AV block with a PR interval at 228 ms.  Right bundle-branch block with repolarization changes.  Old inferior MI.  Anterolateral Q waves.  April 2016 ECG (independently read by me): Sinus bradycardia 58 bpm.  Left axis deviation.  Poor progression V1 through V4.  No significant ST-T changes.  February 2015 ECG (independently read by me): Sinus bradycardia 57 beats per minute. Isolated PVC.   ECG of August 2014 : Sinus bradycardia at 48 beats per minute with mild sinus arrhythmia. Poor anterior R-wave progression compatible with his old anterior wall MI.  LABS:  BMP Latest Ref Rng & Units 09/04/2016 09/03/2016 01/22/2016  Glucose 65 - 99 mg/dL 160(H) 212(H) 193(H)  BUN 6 - 20 mg/dL '19 18 21  ' Creatinine 0.61 - 1.24 mg/dL 1.12 1.15 1.02  Sodium 135 - 145 mmol/L 132(L) 130(L) 137  Potassium 3.5 - 5.1 mmol/L 4.0 4.6 4.7  Chloride 101 - 111 mmol/L 97(L) 96(L) 102  CO2 22 - 32 mmol/L '28 24 24  ' Calcium 8.9 - 10.3 mg/dL 8.4(L) 8.7(L) 9.2    Hepatic Function  Latest Ref Rng & Units 01/22/2016 11/24/2014 12/05/2013  Total Protein 6.1 - 8.1 g/dL 6.7 7.6 6.9  Albumin 3.6 - 5.1 g/dL 4.4 4.8 4.5  AST 10 - 35 U/L 48(H) 35 47(H)  ALT 9 - 46 U/L 43 29 47  Alk Phosphatase 40 - 115 U/L 122(H) 115 111  Total Bilirubin 0.2 - 1.2 mg/dL 0.8 0.7 0.5  Bilirubin, Direct 0.0 - 0.3 mg/dL - - 0.1    CBC Latest Ref Rng & Units 09/04/2016 09/03/2016 01/22/2016  WBC 3.8 - 10.6 K/uL 4.8 8.0 5.8  Hemoglobin 13.0 - 18.0 g/dL 12.5(L) 12.8(L) 13.7  Hematocrit 40.0 - 52.0 % 36.3(L) 37.2(L) 40.6  Platelets 150 - 440 K/uL 158 192 151   Lab Results  Component Value Date   MCV 89.6 09/04/2016   MCV 89.5 09/03/2016   MCV 90.8 01/22/2016    Lab Results  Component Value Date   TSH 0.88 01/22/2016    No results found for: HGBA1C  BNP No results found for: PROBNP    Lipid Panel     Component Value Date/Time   CHOL 108 (L) 01/22/2016 1056   TRIG 180 (H) 01/22/2016 1056   HDL 37 (L) 01/22/2016 1056   CHOLHDL 2.9 01/22/2016 1056   VLDL 36 (H) 01/22/2016 1056   LDLCALC 35 01/22/2016 1056      RADIOLOGY: No results found.  IMPRESSION: 1. CAD S/P percutaneous coronary angioplasty   2. Hyperlipidemia with target LDL less than 70   3. Essential hypertension   4. First degree AV block   5. Right bundle branch block (RBBB) with left anterior hemiblock    ASSESSMENT AND PLAN: [name] [name] is a  young appearing 85 year old active gentleman who is 18 years following his anterior wall myocardial infarction with successful acute intervention in May 2002. He had significant myocardial salvage with only a small area of distal LAD scar.  Subsequently, he has continued to do exceptionally well and remains active.  He is still working at age 68.  His blood pressure today is stable and not orthostatic on a regimen consisting of Toprol-XL 12.5 mg, ramipril 10 mg daily, isosorbide 30 mg daily and amlodipine 5 mg.  He has noticed several transient episodes of very short-lived  dizziness.  His ECG today shows sinus rhythm at 66.  He has stable first-degree block, right bundle branch block and left anterior hemiblock.  I have recommended that if he continues to notice these episodes it we may want to decrease ramipril down to 5 mg from his current dose of 10 mg.  He continues to be on aspirin and Plavix and is doing well without bleeding.  He is diabetic on glipizide.  He had blood work done from his primary physician this week and he will be seeing his primary, Dr. Kary Kos later this week for follow-up evaluation.  Target LDL is less than 70.  As long as he continues to remain stable, I will see him in 1 year for reevaluation or sooner if recurrent problems intervene.  Time spent: 25 minutes  Troy Sine, MD, [facility]  06/14/2019 4:22 PM

## 2019-06-13 NOTE — Patient Instructions (Signed)
Follow-Up:  IN 12 months In Person You may see Shelva Majestic, MD or one of the following Advanced Practice Providers on your designated Care Team:  Rosaria Ferries, PA-C Jory Sims, DNP, ANP Cadence Kathlen Mody, NP  Please call our office in advance, AUGUST 2021 to schedule this October 2021 appointment.  Medication Instructions:  The current medical regimen is effective;  continue present plan and medications as directed. Please refer to the Current Medication list given to you today. If you need a refill on your cardiac medications before your next appointment, please call your pharmacy.  At [facility], you and your health needs are our priority.  As part of our continuing mission to provide you with exceptional heart care, we have created designated Provider Care Teams.  These Care Teams include your primary Cardiologist (physician) and Advanced Practice Providers (APPs -  Physician Assistants and Nurse Practitioners) who all work together to provide you with the care you need, when you need it.  Thank you for choosing [facility]!!

## 2019-06-14 ENCOUNTER — Encounter: Payer: Self-pay | Admitting: Cardiovascular Disease

## 2020-03-09 ENCOUNTER — Encounter: Payer: Self-pay | Admitting: Cardiovascular Disease

## 2020-03-13 ENCOUNTER — Telehealth: Payer: Self-pay | Admitting: Cardiovascular Disease

## 2020-03-13 NOTE — Telephone Encounter (Signed)
MD returning Dr.Kelly's call from last Friday. Notified that Dr.Kelly is in office this afternoon and I would let him know he called.

## 2020-03-13 NOTE — Telephone Encounter (Signed)
Please give Dr. Lilia Pro a call regarding this pt.

## 2020-03-14 NOTE — Telephone Encounter (Signed)
Dr.Kelly aware

## 2020-03-22 ENCOUNTER — Other Ambulatory Visit: Payer: Self-pay

## 2020-03-22 ENCOUNTER — Ambulatory Visit (INDEPENDENT_AMBULATORY_CARE_PROVIDER_SITE_OTHER): Payer: Medicare HMO | Admitting: Cardiology

## 2020-03-22 ENCOUNTER — Encounter: Payer: Self-pay | Admitting: Cardiology

## 2020-03-22 DIAGNOSIS — I10 Essential (primary) hypertension: Secondary | ICD-10-CM | POA: Diagnosis not present

## 2020-03-22 DIAGNOSIS — M170 Bilateral primary osteoarthritis of knee: Secondary | ICD-10-CM

## 2020-03-22 DIAGNOSIS — I4510 Unspecified right bundle-branch block: Secondary | ICD-10-CM

## 2020-03-22 DIAGNOSIS — I2510 Atherosclerotic heart disease of native coronary artery without angina pectoris: Secondary | ICD-10-CM

## 2020-03-22 DIAGNOSIS — E119 Type 2 diabetes mellitus without complications: Secondary | ICD-10-CM

## 2020-03-22 DIAGNOSIS — E785 Hyperlipidemia, unspecified: Secondary | ICD-10-CM | POA: Diagnosis not present

## 2020-03-22 DIAGNOSIS — Z01818 Encounter for other preprocedural examination: Secondary | ICD-10-CM

## 2020-03-22 DIAGNOSIS — Z9861 Coronary angioplasty status: Secondary | ICD-10-CM

## 2020-03-22 NOTE — Assessment & Plan Note (Addendum)
Patient seen by Dr Claiborne Billings and myself.  We feel he is an acceptable risk for the proposed procedure without further work up.

## 2020-03-22 NOTE — Assessment & Plan Note (Signed)
Rt TKR 2012, now needs LTKR

## 2020-03-22 NOTE — Assessment & Plan Note (Signed)
On oral agents followed by PCP. 

## 2020-03-22 NOTE — Assessment & Plan Note (Signed)
On high dose statin Rx 

## 2020-03-22 NOTE — Assessment & Plan Note (Signed)
RBBB

## 2020-03-22 NOTE — Assessment & Plan Note (Signed)
AWMI treated with LAD PCI-stent [facility] 2002 Myioview low risk 2012 (pre op knee)

## 2020-03-22 NOTE — Patient Instructions (Signed)
Medication Instructions:  Your physician recommends that you continue on your current medications as directed. Please refer to the Current Medication list given to you today.  *If you need a refill on your cardiac medications before your next appointment, please call your pharmacy*    Testing/Procedures: You have been cleared for your upcoming procedure.   Follow-Up: At [facility], you and your health needs are our priority.  As part of our continuing mission to provide you with exceptional heart care, we have created designated Provider Care Teams.  These Care Teams include your primary Cardiologist (physician) and Advanced Practice Providers (APPs -  Physician Assistants and Nurse Practitioners) who all work together to provide you with the care you need, when you need it.  We recommend signing up for the patient portal called "MyChart".  Sign up information is provided on this After Visit Summary.  MyChart is used to connect with patients for Virtual Visits (Telemedicine).  Patients are able to view lab/test results, encounter notes, upcoming appointments, etc.  Non-urgent messages can be sent to your provider as well.   To learn more about what you can do with MyChart, go to [email or web address].    Your next appointment:   October  The format for your next appointment:   In Person  Provider:   Shelva Majestic, MD

## 2020-03-22 NOTE — Assessment & Plan Note (Signed)
Controlled.  

## 2020-03-22 NOTE — Progress Notes (Signed)
Cardiology Office Note:    Date:  03/22/2020   ID:  [name], DOB [birth year], MRN [id number]  PCP:  [name] Pink, MD  Cardiologist:  Dr Claiborne Billings Electrophysiologist:  None   Referring MD: [name] [name], P*   No chief complaint on file. Pre op clearance   History of Present Illness:    [name] [name] is a remarkable 86 y.o. male, [place] veteran, followed by Dr Claiborne Billings with a history of a remote [place] Day weekend in 2002. He was treated with thrombectomy and stenting of his LAD. His last functional study was a Myoview in 2012 before his Lt knee replacement. 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 He is here today to be cleared for Rt knee replacement.  He plans to have his surgery in [place] so he can be near his son for rehab.  The patient is active as noted.  He denies any chest pain or NTG use.  He denies any unusual SOB.   Past Medical History:  Diagnosis Date  . Arthritis    arthritis in knees   . Bradycardia 03/29/2013  . CAD S/P percutaneous coronary angioplasty 03/29/2013   AWMI treated with LAD PCI-stent [place] day 2002 Myioview low risk 2012 (pre op knee)  . Cancer (HCC)    hx of basal cell carcinoma   . CAP (community acquired pneumonia) 09/03/2016  . Coronary artery disease 07/30/11   Nuclear stress test-post-test EF is 45% Lowrisk scan . when compard to previouc study there is no significant change.  . Dizziness 09/28/2013  . DJD (degenerative joint disease) of knee 05/27/2018  . Essential hypertension 03/29/2013  . First degree AV block 01/23/2016  . GERD (gastroesophageal reflux disease)   . Hyperlipidemia with target LDL less than 70 03/29/2013  . Hypertension 09/19/10   ECHO-EF53% The transmitral spectral doppler flow pattern suggestive of impairded LV relaxation--likely normal for age with no evidence of ^ LA pressures or LV filling pressures. IVC is dilated, measureing 2.2cm, but unable  to assess respirophasic variation.The dilated IVC would suggest ^ CVP/RAP of ~ 10-15 mmHg. RVSP is estimated at 25-35 mmHg consistant with borderline elevated pressures.  . Myocardial infarction ([place])    2002, stent implanted   . Non-insulin dependent type 2 diabetes mellitus ([place]) 05/27/2018  . Right bundle branch block 01/23/2016   RBBB LAFB, borderline asymptomatic bradycardia   . Stroke [facility])    TIA 05/2011     Past Surgical History:  Procedure Laterality Date  . CARDIAC CATHETERIZATION    . CORONARY ANGIOPLASTY     stent 2002   . HERNIA REPAIR     bilateral   . TOTAL KNEE ARTHROPLASTY  09/12/2011   Procedure: TOTAL KNEE ARTHROPLASTY;  Surgeon: [name] Hai, MD;  Location: [facility];  Service: Orthopedics;  Laterality: Left;    Current Medications: Current Meds  Medication Sig  . amLODipine (NORVASC) 5 MG tablet Take 1 tablet (5 mg total) by mouth daily.  Marland Kitchen aspirin EC 81 MG tablet Take 81 mg by mouth daily before breakfast.   . clopidogrel (PLAVIX) 75 MG tablet Take 1 tablet (75 mg total) by mouth daily.  Marland Kitchen glipiZIDE (GLUCOTROL XL) 5 MG 24 hr tablet Take 5 mg by mouth daily with breakfast.  . isosorbide mononitrate (IMDUR) 30 MG 24 hr tablet Take 1 tablet (30 mg total) by mouth daily.  . Magnesium 500 MG CAPS Take 1 capsule  by mouth daily.   . meloxicam (MOBIC) 15 MG tablet TAKE 1 TABLET BY MOUTH EVERY DAY  . metoprolol succinate (TOPROL-XL) 25 MG 24 hr tablet Take 0.5 tablets (12.5 mg total) by mouth daily.  . Multiple Vitamin (MULITIVITAMIN WITH MINERALS) TABS Take 1 tablet by mouth daily.   . ramipril (ALTACE) 10 MG capsule TAKE 1 CAPSULE DAILY  . rosuvastatin (CRESTOR) 40 MG tablet TAKE 1 TABLET DAILY     Allergies:   Celebrex [celecoxib], Hydrocodone, Hydrocodone-acetaminophen, and Vicodin [hydrocodone-acetaminophen]   Social History   Socioeconomic History  . Marital status: Single    Spouse name: Not on file  . Number of children: Not on file  . Years of  education: Not on file  . Highest education level: Not on file  Occupational History  . Not on file  Tobacco Use  . Smoking status: Former Smoker    Quit date: 08/25/1950    Years since quitting: 69.6  . Smokeless tobacco: Never Used  Substance and Sexual Activity  . Alcohol use: No  . Drug use: No  . Sexual activity: Not on file  Other Topics Concern  . Not on file  Social History Narrative  . Not on file   Social Determinants of Health   Financial Resource Strain:   . Difficulty of Paying Living Expenses:   Food Insecurity:   . Worried About Charity fundraiser in the Last Year:   . Arboriculturist in the Last Year:   Transportation Needs:   . Film/video editor (Medical):   Marland Kitchen Lack of Transportation (Non-Medical):   Physical Activity:   . Days of Exercise per Week:   . Minutes of Exercise per Session:   Stress:   . Feeling of Stress :   Social Connections:   . Frequency of Communication with Friends and Family:   . Frequency of Social Gatherings with Friends and Family:   . Attends Religious Services:   . Active Member of Clubs or Organizations:   . Attends Archivist Meetings:   Marland Kitchen Marital Status:      Family History: The patient's family history includes COPD in his father.  ROS:   Please see the history of present illness.     All other systems reviewed and are negative.  EKGs/Labs/Other Studies Reviewed:    The following studies were reviewed today: Myoview 2012  EKG:  EKG is not ordered today.  The EKG done 03/09/2020 demonstrates NSR- HR 63, LAD, RBBB, inferior and lateral Qs- no change from Oct 2020.  Recent Labs: No results found for requested labs within last 8760 hours.  Recent Lipid Panel    Component Value Date/Time   CHOL 108 (L) 01/22/2016 1056   TRIG 180 (H) 01/22/2016 1056   HDL 37 (L) 01/22/2016 1056   CHOLHDL 2.9 01/22/2016 1056   VLDL 36 (H) 01/22/2016 1056   LDLCALC 35 01/22/2016 1056    Physical Exam:    VS:  BP  (!) 120/60   Pulse 63   Temp (!) 97.1 F (36.2 C)   Ht 5\' 6"  (1.676 m)   Wt 173 lb 9.6 oz (78.7 kg)   SpO2 93%   BMI 28.02 kg/m     Wt Readings from Last 3 Encounters:  03/22/20 173 lb 9.6 oz (78.7 kg)  06/13/19 182 lb (82.6 kg)  05/27/18 178 lb (80.7 kg)     GEN: Well nourished, well developed in no acute distress HEENT: Normal NECK: No  JVD; No carotid bruits CARDIAC: RRR, no murmurs, rubs, gallops RESPIRATORY:  Clear to auscultation without rales, wheezing or rhonchi  ABDOMEN: Soft, non-tender, non-distended MUSCULOSKELETAL:  No edema; No deformity  SKIN: Warm and dry NEUROLOGIC:  Alert and oriented x 3 PSYCHIATRIC:  Normal affect   ASSESSMENT:    Pre-operative clearance Patient seen by Dr Claiborne Billings and myself.  We feel he is an acceptable risk for the proposed procedure without further work up.   CAD S/P percutaneous coronary angioplasty AWMI treated with LAD PCI-stent [place] day 2002 Myioview low risk 2012 (pre op knee)  Essential hypertension Controlled  Hyperlipidemia with target LDL less than 70 On high dose statin Rx  Right bundle branch block RBBB -   DJD (degenerative joint disease) of knee Rt TKR 2012, now needs LTKR  Non-insulin dependent type 2 diabetes mellitus ([place]) On oral agents- followed by PCP.  PLAN:    Cleared for knee replacement. OK to hold aspirin 5-7 days pre op if needed. Follow up with Dr Claiborne Billings in October.   Medication Adjustments/Labs and Tests Ordered: Current medicines are reviewed at length with the patient today.  Concerns regarding medicines are outlined above.  No orders of the defined types were placed in this encounter.  No orders of the defined types were placed in this encounter.   Patient Instructions  Medication Instructions:  Your physician recommends that you continue on your current medications as directed. Please refer to the Current Medication list given to you today.  *If you need a refill on your cardiac  medications before your next appointment, please call your pharmacy*    Testing/Procedures: You have been cleared for your upcoming procedure.   Follow-Up: At [facility], you and your health needs are our priority.  As part of our continuing mission to provide you with exceptional heart care, we have created designated Provider Care Teams.  These Care Teams include your primary Cardiologist (physician) and Advanced Practice Providers (APPs -  Physician Assistants and Nurse Practitioners) who all work together to provide you with the care you need, when you need it.  We recommend signing up for the patient portal called "MyChart".  Sign up information is provided on this After Visit Summary.  MyChart is used to connect with patients for Virtual Visits (Telemedicine).  Patients are able to view lab/test results, encounter notes, upcoming appointments, etc.  Non-urgent messages can be sent to your provider as well.   To learn more about what you can do with MyChart, go to [email or web address].    Your next appointment:   October  The format for your next appointment:   In Person  Provider:   Shelva Majestic, MD     Signed, Kerin Ransom, PA-C  03/22/2020 8:44 AM    [place]

## 2020-06-07 ENCOUNTER — Other Ambulatory Visit: Payer: Self-pay | Admitting: Cardiovascular Disease

## 2020-06-20 ENCOUNTER — Other Ambulatory Visit: Payer: Self-pay | Admitting: Cardiovascular Disease

## 2020-07-10 ENCOUNTER — Encounter: Payer: Self-pay | Admitting: Cardiovascular Disease

## 2020-07-10 ENCOUNTER — Ambulatory Visit (INDEPENDENT_AMBULATORY_CARE_PROVIDER_SITE_OTHER): Payer: Medicare HMO | Admitting: Cardiovascular Disease

## 2020-07-10 ENCOUNTER — Other Ambulatory Visit: Payer: Self-pay

## 2020-07-10 VITALS — BP 100/68 | HR 54 | Ht 66.0 in | Wt 168.4 lb

## 2020-07-10 DIAGNOSIS — I2510 Atherosclerotic heart disease of native coronary artery without angina pectoris: Secondary | ICD-10-CM

## 2020-07-10 DIAGNOSIS — E785 Hyperlipidemia, unspecified: Secondary | ICD-10-CM

## 2020-07-10 DIAGNOSIS — I452 Bifascicular block: Secondary | ICD-10-CM

## 2020-07-10 DIAGNOSIS — Z5181 Encounter for therapeutic drug level monitoring: Secondary | ICD-10-CM | POA: Diagnosis not present

## 2020-07-10 DIAGNOSIS — E119 Type 2 diabetes mellitus without complications: Secondary | ICD-10-CM

## 2020-07-10 DIAGNOSIS — I10 Essential (primary) hypertension: Secondary | ICD-10-CM

## 2020-07-10 DIAGNOSIS — Z9861 Coronary angioplasty status: Secondary | ICD-10-CM

## 2020-07-10 NOTE — Progress Notes (Signed)
Patient ID: [name] [name], male   DOB: 11-27-1920, 86 y.o.   MRN: [id number]    Primary: [name] Pate, PA-C  HPI: [name] [name] is a 86 y.o. male who presents to the office today for a greater than 3-year cardiology evaluation with me.  I last saw him in May 2017.  He was last seen in our practice in October 2019 by Kerin Ransom, PA-C.  [name] [name] has CAD and in May 2002 suffered an anterior wall myocardial infarction. He underwent  acute catheterization, thrombectomy, and stenting of a proximal LAD occlusion with excellent reperfusion time of less than 2 hours. He had significant myocardial salvage and only has a small area of residual scarring in the anteroapical, septal, apical walls. His last nuclear perfusion study in December 2012 which was done prior to knee replacement surgery only showed a small region of distal LAD scar without ischemia.  When I last saw him in May 2017 he continued to feel well and was remaining active working in housing maintenance.  He was taking Crestor 40 mg and omega-3 fatty acids for hyperlipidemia and Toprol XL 12.5 mg daily,  isosorbide mononitrate 30 mg in addition to amlodipine 5 mg, both for blood pressure and CAD.  He continued to be on dual antiplatelet therapy with aspirin and Plavix and denies bleeding.   I last saw him in October 2020.  At which time he continued to do well He lives by himself since his wife died 5 years ago.  He continues to work several days per week and does at least 3-4 jobs on an individual basis doing maintenance at [employer].  Rarely he has noticed a transient dizziness most of the time he feels well.  He denies palpitations.  He denies recurrent anginal symptoms.  He had blood work at the [facility].  He does have difficulty with his knees.   Since I last saw him, he continues to feel well.  He had undergone right knee surgery in [place] and had a total knee replacement and tolerated this well.  Remotely he had undergone  surgery to his left knee over 20 years ago.  He currently is now working 2 jobs.  He is helping his daughter and is working 5 hours/day at [employer] and also continues to do maintenance work at his other job.  Presently he is on amlodipine 5 mg, isosorbide 30 mg, metoprolol succinate 12.5 mg and ramipril 10 mg for hypertension and CAD.  He has continued to be on aspirin and Plavix.  He is on Ozempic for diabetes.  He is on rosuvastatin 40 mg for hyperlipidemia.   Past Medical History:  Diagnosis Date  . Arthritis    arthritis in knees   . Bradycardia 03/29/2013  . CAD S/P percutaneous coronary angioplasty 03/29/2013   AWMI treated with LAD PCI-stent [place] day 2002 Myioview low risk 2012 (pre op knee)  . Cancer (HCC)    hx of basal cell carcinoma   . CAP (community acquired pneumonia) 09/03/2016  . Coronary artery disease 07/30/11   Nuclear stress test-post-test EF is 45% Lowrisk scan . when compard to previouc study there is no significant change.  . Dizziness 09/28/2013  . DJD (degenerative joint disease) of knee 05/27/2018  . Essential hypertension 03/29/2013  . First degree AV block 01/23/2016  . GERD (gastroesophageal reflux disease)   . Hyperlipidemia with target LDL less than 70 03/29/2013  . Hypertension 09/19/10   ECHO-EF53% The transmitral spectral doppler flow pattern  suggestive of impairded LV relaxation--likely normal for age with no evidence of ^ LA pressures or LV filling pressures. IVC is dilated, measureing 2.2cm, but unable to assess respirophasic variation.The dilated IVC would suggest ^ CVP/RAP of ~ 10-15 mmHg. RVSP is estimated at 25-35 mmHg consistant with borderline elevated pressures.  . Myocardial infarction ([place])    2002, stent implanted   . Non-insulin dependent type 2 diabetes mellitus ([place]) 05/27/2018  . Right bundle branch block 01/23/2016   RBBB LAFB, borderline asymptomatic bradycardia   . Stroke [facility])    TIA 05/2011     Past Surgical History:  Procedure Laterality  Date  . CARDIAC CATHETERIZATION    . CORONARY ANGIOPLASTY     stent 2002   . HERNIA REPAIR     bilateral   . TOTAL KNEE ARTHROPLASTY  09/12/2011   Procedure: TOTAL KNEE ARTHROPLASTY;  Surgeon: Johnn Hai, MD;  Location: [facility];  Service: Orthopedics;  Laterality: Left;    Allergies  Allergen Reactions  . Celebrex [Celecoxib]     dizzy  . Hydrocodone     Other reaction(s): Other (See Comments) Nightmares  . Hydrocodone-Acetaminophen Other (See Comments)    NIGHTMARES  . Vicodin [Hydrocodone-Acetaminophen] Other (See Comments)    NIGHTMARES     Current Outpatient Medications  Medication Sig Dispense Refill  . amLODipine (NORVASC) 5 MG tablet TAKE 1 TABLET DAILY 90 tablet 3  . aspirin EC 81 MG tablet Take 81 mg by mouth daily before breakfast.     . glipiZIDE (GLUCOTROL XL) 5 MG 24 hr tablet Take 5 mg by mouth daily with breakfast.    . isosorbide mononitrate (IMDUR) 30 MG 24 hr tablet TAKE 1 TABLET DAILY (PLEASE CONTACT OFFICE FOR ADDITIONAL REFILLS FINAL WARNING NO FURTHER REFILLS) 90 tablet 3  . Magnesium 500 MG CAPS Take 1 capsule by mouth daily.     . metoprolol succinate (TOPROL-XL) 25 MG 24 hr tablet TAKE ONE-HALF (1/2) TABLET DAILY 45 tablet 3  . Multiple Vitamin (MULITIVITAMIN WITH MINERALS) TABS Take 1 tablet by mouth daily.     Marland Kitchen OZEMPIC, 0.25 OR 0.5 MG/DOSE, 2 MG/1.5ML SOPN Inject into the skin once a week.    . ramipril (ALTACE) 10 MG capsule TAKE 1 CAPSULE DAILY 90 capsule 3  . rosuvastatin (CRESTOR) 40 MG tablet TAKE 1 TABLET DAILY (NEED TO SCHEDULE APPOINTMENT FOR FURTHER REFILLS) 90 tablet 3   No current facility-administered medications for this visit.    Social History   Socioeconomic History  . Marital status: Single    Spouse name: Not on file  . Number of children: Not on file  . Years of education: Not on file  . Highest education level: Not on file  Occupational History  . Not on file  Tobacco Use  . Smoking status: Former Smoker    Quit  date: 08/25/1950    Years since quitting: 69.9  . Smokeless tobacco: Never Used  Substance and Sexual Activity  . Alcohol use: No  . Drug use: No  . Sexual activity: Not on file  Other Topics Concern  . Not on file  Social History Narrative  . Not on file   Social Determinants of Health   Financial Resource Strain:   . Difficulty of Paying Living Expenses: Not on file  Food Insecurity:   . Worried About Charity fundraiser in the Last Year: Not on file  . Ran Out of Food in the Last Year: Not on file  Transportation Needs:   .  Lack of Transportation (Medical): Not on file  . Lack of Transportation (Non-Medical): Not on file  Physical Activity:   . Days of Exercise per Week: Not on file  . Minutes of Exercise per Session: Not on file  Stress:   . Feeling of Stress : Not on file  Social Connections:   . Frequency of Communication with Friends and Family: Not on file  . Frequency of Social Gatherings with Friends and Family: Not on file  . Attends Religious Services: Not on file  . Active Member of Clubs or Organizations: Not on file  . Attends Archivist Meetings: Not on file  . Marital Status: Not on file  Intimate Partner Violence:   . Fear of Current or Ex-Partner: Not on file  . Emotionally Abused: Not on file  . Physically Abused: Not on file  . 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  Family history is notable for both parents and all siblings are deceased.   ROS General: Negative; No fevers, chills, or night sweats;  HEENT: Negative; No changes in vision or hearing, sinus congestion, difficulty swallowing Pulmonary: Negative; No cough, wheezing, shortness of breath,  hemoptysis Cardiovascular: Negative; No chest pain, presyncope, syncope, palpitations GI: Negative; No nausea, vomiting, diarrhea, or abdominal pain GU: Negative; No dysuria, hematuria, or difficulty voiding Musculoskeletal: Bilateral knee discomfort, status post left knee surgery, remotely and recent right knee surgery done in [place] Hematologic/Oncology: Easy bruisability Endocrine: Negative; no heat/cold intolerance; no diabetes Neuro: Negative; no changes in balance, headaches Skin: Negative; No rashes or skin lesions Psychiatric: Negative; No behavioral problems, depression Sleep: Negative; No snoring, daytime sleepiness, hypersomnolence, bruxism, restless legs, hypnogognic hallucinations, no cataplexy Other comprehensive 14 point system review is negative.  PE BP 100/68 (BP Location: Left Arm, Patient Position: Sitting)   Pulse (!) 54   Ht '5\' 6"'  (1.676 m)   Wt 168 lb 6.4 oz (76.4 kg)   SpO2 95%   BMI 27.18 kg/m    Repeat blood pressure by me was 112/68  Wt Readings from Last 3 Encounters:  07/10/20 168 lb 6.4 oz (76.4 kg)  03/22/20 173 lb 9.6 oz (78.7 kg)  06/13/19 182 lb (82.6 kg)   General: Alert, oriented, no distress.  Skin: normal turgor, no rashes, warm and dry HEENT: Normocephalic, atraumatic. Pupils equal round and reactive to light; sclera anicteric; extraocular muscles intact;  Nose without nasal septal hypertrophy Mouth/Parynx benign; Mallinpatti scale 2 Neck: No JVD, no carotid bruits; normal carotid upstroke Lungs: clear to ausculatation and percussion; no wheezing or rales Chest wall: without tenderness to palpitation Heart: PMI not displaced, RRR, s1 s2 normal, 1/6 systolic murmur, no diastolic murmur, no rubs, gallops, thrills, or heaves Abdomen: soft, nontender; no hepatosplenomehaly, BS+; abdominal aorta nontender and not dilated by palpation. Back: no CVA tenderness Pulses 2+ Musculoskeletal: full range of motion, normal strength, no joint  deformities Extremities: no clubbing cyanosis or edema, Homan's sign negative  Neurologic: grossly nonfocal; Cranial nerves grossly wnl Psychologic: Normal mood and affect   ECG (independently read by me): Sinus bradycardia at 54, RBBB, LAHB   October 2020 ECG (independently read by me): Sinus rhythm at 66 bpm with  first-degree block, right bundle branch block, and left anterior hemiblock.  Old inferior MI.  QTc interval 461 ms, PR interval 214 ms.  May 2017 ECG (independently read by me): Sinus bradycardia at 56 bpm with first-degree AV block with a PR interval at 228 ms.  Right bundle-branch block with repolarization changes.  Old inferior MI.  Anterolateral Q waves.  April 2016 ECG (independently read by me): Sinus bradycardia 58 bpm.  Left axis deviation.  Poor progression V1 through V4.  No significant ST-T changes.  February 2015 ECG (independently read by me): Sinus bradycardia 57 beats per minute. Isolated PVC.   ECG of August 2014 : Sinus bradycardia at 48 beats per minute with mild sinus arrhythmia. Poor anterior R-wave progression compatible with his old anterior wall MI.  LABS:  BMP Latest Ref Rng & Units 09/04/2016 09/03/2016 01/22/2016  Glucose 65 - 99 mg/dL 160(H) 212(H) 193(H)  BUN 6 - 20 mg/dL '19 18 21  ' Creatinine 0.61 - 1.24 mg/dL 1.12 1.15 1.02  Sodium 135 - 145 mmol/L 132(L) 130(L) 137  Potassium 3.5 - 5.1 mmol/L 4.0 4.6 4.7  Chloride 101 - 111 mmol/L 97(L) 96(L) 102  CO2 22 - 32 mmol/L '28 24 24  ' Calcium 8.9 - 10.3 mg/dL 8.4(L) 8.7(L) 9.2    Hepatic Function Latest Ref Rng & Units 01/22/2016 11/24/2014 12/05/2013  Total Protein 6.1 - 8.1 g/dL 6.7 7.6 6.9  Albumin 3.6 - 5.1 g/dL 4.4 4.8 4.5  AST 10 - 35 U/L 48(H) 35 47(H)  ALT 9 - 46 U/L 43 29 47  Alk Phosphatase 40 - 115 U/L 122(H) 115 111  Total Bilirubin 0.2 - 1.2 mg/dL 0.8 0.7 0.5  Bilirubin, Direct 0.0 - 0.3 mg/dL - - 0.1    CBC Latest Ref Rng & Units 09/04/2016 09/03/2016 01/22/2016  WBC 3.8 - 10.6 K/uL 4.8  8.0 5.8  Hemoglobin 13.0 - 18.0 g/dL 12.5(L) 12.8(L) 13.7  Hematocrit 40 - 52 % 36.3(L) 37.2(L) 40.6  Platelets 150 - 440 K/uL 158 192 151   Lab Results  Component Value Date   MCV 89.6 09/04/2016   MCV 89.5 09/03/2016   MCV 90.8 01/22/2016    Lab Results  Component Value Date   TSH 0.88 01/22/2016    No results found for: HGBA1C  BNP No results found for: PROBNP    Lipid Panel     Component Value Date/Time   CHOL 108 (L) 01/22/2016 1056   TRIG 180 (H) 01/22/2016 1056   HDL 37 (L) 01/22/2016 1056   CHOLHDL 2.9 01/22/2016 1056   VLDL 36 (H) 01/22/2016 1056   LDLCALC 35 01/22/2016 1056      RADIOLOGY: No results found.  IMPRESSION: 1. Essential hypertension   2. Hyperlipidemia with target LDL less than 70   3. Therapeutic drug monitoring   4. CAD S/P percutaneous coronary angioplasty   5. Non-insulin dependent type 2 diabetes mellitus ([place])   6. Right bundle branch block (RBBB) with left anterior hemiblock    ASSESSMENT AND PLAN: Mr Rochel [name] is a  young appearing 86 year old active gentleman who is 19 years following his anterior wall myocardial infarction with successful acute intervention in May 2002. He had significant myocardial salvage with only a small area of distal LAD scar.  Subsequently, he has continued to do exceptionally well and remains active.  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  Since I last saw him he had undergone successful right knee total replacement surgery in [place] and stayed with his son who lives there.  His blood pressure today is stable on amlodipine, isosorbide 30 mg, metoprolol succinate 12.5 mg in addition to ramipril 10 mg.  He continues to be on rosuvastatin 40 mg for hyperlipidemia with target LDL less than 70.  Laboratory is done at the [facility] in [place] by Dr. Maryland Pink.  He is diabetic on  Ozempic and glipizide 5 mg.  With his age of 64 years and easy bruisability I have recommended he discontinue Plavix but continue being on aspirin 81 mg alone.  His ECG is stable and shows right bundle branch block with left anterior hemiblock.  I will asked that labs be sent to me for review.  I will see him in 1 year for evaluation or sooner as needed.  Troy Sine, MD, [facility]  07/11/2020 8:28 PM   o

## 2020-07-10 NOTE — Patient Instructions (Addendum)
Medication Instructions:  STOP PLAVIX  (CLOPIDOGREL)   *If you need a refill on your cardiac medications before your next appointment, please call your pharmacy*  Lab Work: LP/CMET/CBC/TSH SOON   If you have labs (blood work) drawn today and your tests are completely normal, you will receive your results only by: Marland Kitchen MyChart Message (if you have MyChart) OR . A paper copy in the mail If you have any lab test that is abnormal or we need to change your treatment, we will call you to review the results.  Testing/Procedures: NONE   Follow-Up: At [facility], you and your health needs are our priority.  As part of our continuing mission to provide you with exceptional heart care, we have created designated Provider Care Teams.  These Care Teams include your primary Cardiologist (physician) and Advanced Practice Providers (APPs -  Physician Assistants and Nurse Practitioners) who all work together to provide you with the care you need, when you need it.  We recommend signing up for the patient portal called "MyChart".  Sign up information is provided on this After Visit Summary.  MyChart is used to connect with patients for Virtual Visits (Telemedicine).  Patients are able to view lab/test results, encounter notes, upcoming appointments, etc.  Non-urgent messages can be sent to your provider as well.   To learn more about what you can do with MyChart, go to [email or web address].    Your next appointment:   12 month(s) You will receive a reminder letter in the mail two months in advance. If you don't receive a letter, please call our office to schedule the follow-up appointment.   The format for your next appointment:   In Person  Provider:   DR Claiborne Billings

## 2020-07-11 ENCOUNTER — Encounter: Payer: Self-pay | Admitting: Cardiovascular Disease

## 2021-06-03 ENCOUNTER — Other Ambulatory Visit: Payer: Self-pay | Admitting: Cardiovascular Disease

## 2021-11-13 ENCOUNTER — Other Ambulatory Visit: Payer: Self-pay | Admitting: Cardiovascular Disease

## 2021-12-15 NOTE — Progress Notes (Signed)
? ?Cardiology Clinic Note  ? ?Patient Name: [name] [name] ?Date of Encounter: 12/17/2021 ? ?Primary Care Provider:  Maryland Pink, MD ?Primary Cardiologist:  Shelva Majestic, MD ? ?Patient Profile  ?  ?[name] [name] presents to the clinic today for follow-up evaluation of his coronary artery disease and essential hypertension. ? ?Past Medical History  ?  ?Past Medical History:  ?Diagnosis Date  ? Arthritis   ? arthritis in knees   ? Bradycardia 03/29/2013  ? CAD S/P percutaneous coronary angioplasty 03/29/2013  ? AWMI treated with LAD PCI-stent [place] day 2002 Myioview low risk 2012 (pre op knee)  ? Cancer [facility])   ? hx of basal cell carcinoma   ? CAP (community acquired pneumonia) 09/03/2016  ? Coronary artery disease 07/30/11  ? Nuclear stress test-post-test EF is 45% Lowrisk scan . when compard to previouc study there is no significant change.  ? Dizziness 09/28/2013  ? DJD (degenerative joint disease) of knee 05/27/2018  ? Essential hypertension 03/29/2013  ? First degree AV block 01/23/2016  ? GERD (gastroesophageal reflux disease)   ? Hyperlipidemia with target LDL less than 70 03/29/2013  ? Hypertension 09/19/10  ? ECHO-EF53% The transmitral spectral doppler flow pattern suggestive of impairded LV relaxation--likely normal for age with no evidence of ^ LA pressures or LV filling pressures. IVC is dilated, measureing 2.2cm, but unable to assess respirophasic variation.The dilated IVC would suggest ^ CVP/RAP of ~ 10-15 mmHg. RVSP is estimated at 25-35 mmHg consistant with borderline elevated pressures.  ? Myocardial infarction [facility])   ? 2002, stent implanted   ? Non-insulin dependent type 2 diabetes mellitus ([place]) 05/27/2018  ? Right bundle branch block 01/23/2016  ? RBBB LAFB, borderline asymptomatic bradycardia   ? Stroke [facility])   ? TIA 05/2011   ? ?Past Surgical History:  ?Procedure Laterality Date  ? CARDIAC CATHETERIZATION    ? CORONARY ANGIOPLASTY    ? stent 2002   ? HERNIA REPAIR    ? bilateral   ? TOTAL KNEE  ARTHROPLASTY  09/12/2011  ? Procedure: TOTAL KNEE ARTHROPLASTY;  Surgeon: Johnn Hai, MD;  Location: [facility];  Service: Orthopedics;  Laterality: Left;  ? ? ?Allergies ? ?Allergies  ?Allergen Reactions  ? Celebrex [Celecoxib]   ?  dizzy  ? Hydrocodone   ?  Other reaction(s): Other (See Comments) ?Nightmares  ? Hydrocodone-Acetaminophen Other (See Comments)  ?  NIGHTMARES  ? Vicodin [Hydrocodone-Acetaminophen] Other (See Comments)  ?  NIGHTMARES ?  ? ? ?History of Present Illness  ?  ?[name] [name] has a PMH of MI on Memorial Day weekend and 2002.  He was treated with thrombectomy and stenting to his LAD.  He underwent nuclear stress test 2012 before his left knee replacement.  His PMH also includes arthritis, bradycardia, community-acquired pneumonia, dizziness, degenerative joint disease, hypertension, GERD, hyperlipidemia, CVA/TIA 10/12 and RBBB.  He is a World War II veteran. ? ?Please follow-up with Kerin Ransom, PA-C on 03/22/2020.  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  He presented for preoperative cardiac evaluation prior to right knee replacement.  He planned to have surgery in [place] so he could be near his son for rehabilitation.  He denied chest pain and sublingual nitroglycerin use.  He denied shortness of breath. ? ?He followed up with Dr. Claiborne Billings 07/10/2020.  During that time he reported that he had tolerated his knee surgery well.  He continued to work 2 jobs.  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  He continues on amlodipine, Imdur 30 mg, metoprolol 12.5 mg and ramipril 10 mg.  He was continued on aspirin and Plavix.  He was on Ozempic for his diabetes and rosuvastatin for his hyperlipidemia. ? ?He presents to the clinic today for follow-up evaluation states he feels well.  He continues to work 2 jobs.  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  His major complaint today is with some lack of sensation on the bottom of his feet.  This appears to be neuropathy.  He denies exertional symptoms.  We will continue his current medication regimen and have him follow-up in 1 year. ? ?Today he denies chest pain, shortness of breath, lower extremity edema, fatigue, palpitations, melena, hematuria, hemoptysis, diaphoresis, weakness, presyncope, syncope, orthopnea, and PND. ? ? ? ?Home Medications  ?  ?Prior to Admission medications   ?Medication Sig Start Date End Date Taking? Authorizing Provider  ?amLODipine (NORVASC) 5 MG tablet Take 1 tablet (5 mg total) by mouth daily. Schedule an appointment for further refills, 1st attempt 11/13/21   Troy Sine, MD  ?aspirin EC 81 MG tablet Take 81 mg by mouth daily before breakfast.     [provider]  ?glipiZIDE (GLUCOTROL XL) 5 MG 24 hr tablet Take 5 mg by mouth daily with breakfast.    [provider]  ?isosorbide mononitrate (IMDUR) 30 MG 24 hr tablet Take 1 tablet (30 mg total) by mouth daily. Schedule an appointment for further refills, 1st attempt 11/13/21   Troy Sine, MD  ?Magnesium 500 MG CAPS Take 1 capsule by mouth daily.     [provider]  ?metoprolol succinate (TOPROL-XL) 25 MG 24 hr tablet Take 0.5 tablets (12.5 mg total) by mouth daily. Schedule an appointment for further refills, 1st attempt 11/13/21   Troy Sine, MD  ?Multiple Vitamin (MULITIVITAMIN WITH MINERALS) TABS Take 1 tablet by mouth daily.     [provider]  ?OZEMPIC, 0.25 OR 0.5 MG/DOSE, 2 MG/1.5ML SOPN Inject into the skin once a week. 06/08/20   [provider]  ?ramipril (ALTACE) 10 MG capsule TAKE 1 CAPSULE DAILY. Schedule an appointment for further refills, 1st attempt 11/13/21   Troy Sine, MD  ?rosuvastatin (CRESTOR) 40 MG tablet TAKE 1 TABLET DAILY. Schedule an appointment for further refills, 1st attempt 11/13/21   Troy Sine, MD  ? ? ?Family History  ?  ?Family History  ?Problem Relation Age of Onset  ? COPD Father   ? ?He indicated that his mother is deceased. He indicated that his father is deceased. He indicated that his sister is deceased. He indicated that his brother is deceased. ? ?Social History  ?  ?Social History  ? ?Socioeconomic History  ? Marital status: Single  ?  Spouse name: Not on file  ? Number of children: Not on file  ? Years of education: Not on file  ? Highest education level: Not on file  ?Occupational History  ? Not on file  ?Tobacco Use  ? Smoking status: Former  ?  Types: Cigarettes  ?  Quit date: 08/25/1950  ?  Years since quitting: 71.3  ? Smokeless tobacco: Never  ?Substance and Sexual Activity  ? Alcohol use: No  ? Drug use: No  ? Sexual activity: Not on file  ?Other  Topics Concern  ? Not on file  ?Social History Narrative  ? Not on file  ? ?Social Determinants of Health  ? ?Financial Resource Strain: Not on file  ?Food Insecurity: Not on file  ?Transportation Needs: Not on file  ?Physical Activity: Not on file  ?Stress: Not on file  ?Social Connections: Not on file  ?Intimate Partner Violence: Not on file  ?  ? ?Review of Systems  ?  ?General:  No chills, fever, night sweats or weight changes.  ?Cardiovascular:  No chest pain, dyspnea on exertion, edema, orthopnea, palpitations, paroxysmal nocturnal dyspnea. ?Dermatological: No rash, lesions/masses ?Respiratory: No cough, dyspnea ?Urologic: No hematuria, dysuria ?Abdominal:   No nausea, vomiting, diarrhea, bright red blood per rectum, melena, or hematemesis ?Neurologic:  No visual changes, wkns, changes in mental status. ?All other systems reviewed and are otherwise negative except as noted above. ? ?Physical Exam  ?  ?VS:  BP 122/60   Pulse 65   Ht '5\' 6"'$  (1.676 m)   Wt 177 lb 6.4 oz (80.5 kg)   SpO2 92%   BMI 28.63 kg/m?  , BMI Body mass index is 28.63 kg/m?. ?GEN: Well nourished, well developed, in no acute distress. ?HEENT: normal. ?Neck:  Supple, no JVD, carotid bruits, or masses. ?Cardiac: RRR, no murmurs, rubs, or gallops. No clubbing, cyanosis, edema.  Radials/DP/PT 2+ and equal bilaterally.  ?Respiratory:  Respirations regular and unlabored, clear

## 2021-12-17 ENCOUNTER — Encounter: Payer: Self-pay | Admitting: General Practice

## 2021-12-17 ENCOUNTER — Ambulatory Visit (INDEPENDENT_AMBULATORY_CARE_PROVIDER_SITE_OTHER): Payer: Medicare Other | Admitting: General Practice

## 2021-12-17 VITALS — BP 122/60 | HR 65 | Ht 66.0 in | Wt 177.4 lb

## 2021-12-17 DIAGNOSIS — I10 Essential (primary) hypertension: Secondary | ICD-10-CM | POA: Diagnosis not present

## 2021-12-17 DIAGNOSIS — Z9861 Coronary angioplasty status: Secondary | ICD-10-CM

## 2021-12-17 DIAGNOSIS — E119 Type 2 diabetes mellitus without complications: Secondary | ICD-10-CM

## 2021-12-17 DIAGNOSIS — I2510 Atherosclerotic heart disease of native coronary artery without angina pectoris: Secondary | ICD-10-CM

## 2021-12-17 DIAGNOSIS — I452 Bifascicular block: Secondary | ICD-10-CM | POA: Diagnosis not present

## 2021-12-17 DIAGNOSIS — E785 Hyperlipidemia, unspecified: Secondary | ICD-10-CM

## 2021-12-17 MED ORDER — AMLODIPINE BESYLATE 5 MG PO TABS
5.0000 mg | ORAL_TABLET | Freq: Every day | ORAL | 3 refills | Status: DC
Start: 2021-12-17 — End: 2022-10-10

## 2021-12-17 MED ORDER — ISOSORBIDE MONONITRATE ER 30 MG PO TB24: 30.0000 mg | ORAL_TABLET | Freq: Every day | ORAL | 3 refills | Status: DC

## 2021-12-17 MED ORDER — METOPROLOL SUCCINATE ER 25 MG PO TB24: 12.5000 mg | ORAL_TABLET | Freq: Every day | ORAL | 3 refills | Status: DC

## 2021-12-17 MED ORDER — CLOPIDOGREL BISULFATE 75 MG PO TABS: 75.0000 mg | ORAL_TABLET | Freq: Every day | ORAL | 3 refills | Status: DC

## 2021-12-17 NOTE — Patient Instructions (Signed)
Medication Instructions:  ?The current medical regimen is effective;  continue present plan and medications as directed. Please refer to the Current Medication list given to you today.  ? ?*If you need a refill on your cardiac medications before your next appointment, please call your pharmacy* ? ?Lab Work:   Testing/Procedures:  ?NONE    NONE ?If you have labs (blood work) drawn today and your tests are completely normal, you will receive your results only by: ?MyChart Message (if you have MyChart) OR  A paper copy in the mail ?If you have any lab test that is abnormal or we need to change your treatment, we will call you to review the results. ? ?Follow-Up: ?Your next appointment:  12 month(s) In Person with Shelva Majestic, MD  ? ?Please call our office 2 months in advance to schedule this appointment  ? ?At [facility], you and your health needs are our priority.  As part of our continuing mission to provide you with exceptional heart care, we have created designated Provider Care Teams.  These Care Teams include your primary Cardiologist (physician) and Advanced Practice Providers (APPs -  Physician Assistants and Nurse Practitioners) who all work together to provide you with the care you need, when you need it. ? ?We recommend signing up for the patient portal called "MyChart".  Sign up information is provided on this After Visit Summary.  MyChart is used to connect with patients for Virtual Visits (Telemedicine).  Patients are able to view lab/test results, encounter notes, upcoming appointments, etc.  Non-urgent messages can be sent to your provider as well.   ?To learn more about what you can do with MyChart, go to [email or web address].   ? ? ? ?Important Information About Sugar ? ? ? ? ? ? ? ?  ? ?

## 2021-12-30 ENCOUNTER — Other Ambulatory Visit: Payer: Self-pay | Admitting: Cardiovascular Disease

## 2022-01-14 ENCOUNTER — Other Ambulatory Visit: Payer: Self-pay

## 2022-10-10 ENCOUNTER — Other Ambulatory Visit: Payer: Self-pay | Admitting: General Practice

## 2022-11-06 ENCOUNTER — Other Ambulatory Visit: Payer: Self-pay | Admitting: Cardiovascular Disease

## 2022-11-06 ENCOUNTER — Other Ambulatory Visit: Payer: Self-pay | Admitting: General Practice

## 2023-01-08 ENCOUNTER — Other Ambulatory Visit: Payer: Self-pay | Admitting: Cardiovascular Disease

## 2023-01-18 ENCOUNTER — Other Ambulatory Visit: Payer: Self-pay | Admitting: Cardiovascular Disease

## 2023-01-30 ENCOUNTER — Other Ambulatory Visit: Payer: Self-pay | Admitting: General Practice

## 2023-02-06 ENCOUNTER — Ambulatory Visit: Payer: Medicare Other | Attending: Nurse Practitioner | Admitting: Nurse Practitioner

## 2023-02-06 ENCOUNTER — Encounter: Payer: Self-pay | Admitting: Nurse Practitioner

## 2023-02-06 VITALS — BP 124/60 | HR 58 | Ht 66.0 in | Wt 174.8 lb

## 2023-02-06 DIAGNOSIS — R001 Bradycardia, unspecified: Secondary | ICD-10-CM | POA: Diagnosis not present

## 2023-02-06 DIAGNOSIS — Z8673 Personal history of transient ischemic attack (TIA), and cerebral infarction without residual deficits: Secondary | ICD-10-CM

## 2023-02-06 DIAGNOSIS — E119 Type 2 diabetes mellitus without complications: Secondary | ICD-10-CM

## 2023-02-06 DIAGNOSIS — I452 Bifascicular block: Secondary | ICD-10-CM

## 2023-02-06 DIAGNOSIS — E785 Hyperlipidemia, unspecified: Secondary | ICD-10-CM

## 2023-02-06 DIAGNOSIS — I10 Essential (primary) hypertension: Secondary | ICD-10-CM | POA: Diagnosis not present

## 2023-02-06 DIAGNOSIS — I2510 Atherosclerotic heart disease of native coronary artery without angina pectoris: Secondary | ICD-10-CM

## 2023-02-06 NOTE — Patient Instructions (Signed)
Medication Instructions:  No medication changes  *If you need a refill on your cardiac medications before your next appointment, please call your pharmacy*  Lab Work: No labs ordered If you have labs (blood work) drawn today and your tests are completely normal, you will receive your results only by: MyChart Message (if you have MyChart) OR A paper copy in the mail If you have any lab test that is abnormal or we need to change your treatment, we will call you to review the results.   Testing/Procedures: No test ordered   Follow-Up: At [facility], you and your health needs are our priority.  As part of our continuing mission to provide you with exceptional heart care, we have created designated Provider Care Teams.  These Care Teams include your primary Cardiologist (physician) and Advanced Practice Providers (APPs -  Physician Assistants and Nurse Practitioners) who all work together to provide you with the care you need, when you need it.  We recommend signing up for the patient portal called "MyChart".  Sign up information is provided on this After Visit Summary.  MyChart is used to connect with patients for Virtual Visits (Telemedicine).  Patients are able to view lab/test results, encounter notes, upcoming appointments, etc.  Non-urgent messages can be sent to your provider as well.   To learn more about what you can do with MyChart, go to [email or web address].    Your next appointment:   6-8 week(s)  Provider:   Bernadene Person, NP

## 2023-02-06 NOTE — Progress Notes (Signed)
Office Visit    Patient Name: [name] [name] Date of Encounter: 02/06/2023  Primary Care Provider:  Jerl Mina, MD Primary Cardiologist:  Nicki Guadalajara, MD  Chief Complaint    89 year old male with a history of CAD s/p DES-LAD in 2002, RBBB, bradycardia, hypertension, hyperlipidemia, TIA, type 2 diabetes, and DJD who presents for follow-up related to CAD.  Past Medical History    Past Medical History:  Diagnosis Date   Arthritis    arthritis in knees    Bradycardia 03/29/2013   CAD S/P percutaneous coronary angioplasty 03/29/2013   AWMI treated with LAD PCI-stent [place] day 2002 Myioview low risk 2012 (pre op knee)   Cancer (HCC)    hx of basal cell carcinoma    CAP (community acquired pneumonia) 09/03/2016   Coronary artery disease 07/30/11   Nuclear stress test-post-test EF is 45% Lowrisk scan . when compard to previouc study there is no significant change.   Dizziness 09/28/2013   DJD (degenerative joint disease) of knee 05/27/2018   Essential hypertension 03/29/2013   First degree AV block 01/23/2016   GERD (gastroesophageal reflux disease)    Hyperlipidemia with target LDL less than 70 03/29/2013   Hypertension 09/19/10   ECHO-EF53% The transmitral spectral doppler flow pattern suggestive of impairded LV relaxation--likely normal for age with no evidence of ^ LA pressures or LV filling pressures. IVC is dilated, measureing 2.2cm, but unable to assess respirophasic variation.The dilated IVC would suggest ^ CVP/RAP of ~ 10-15 mmHg. RVSP is estimated at 25-35 mmHg consistant with borderline elevated pressures.   Myocardial infarction (HCC)    2002, stent implanted    Non-insulin dependent type 2 diabetes mellitus (HCC) 05/27/2018   Right bundle branch block 01/23/2016   RBBB LAFB, borderline asymptomatic bradycardia    Stroke [facility])    TIA 05/2011    Past Surgical History:  Procedure Laterality Date   CARDIAC CATHETERIZATION     CORONARY ANGIOPLASTY     stent 2002    HERNIA  REPAIR     bilateral    TOTAL KNEE ARTHROPLASTY  09/12/2011   Procedure: TOTAL KNEE ARTHROPLASTY;  Surgeon: Javier Docker, MD;  Location: [facility];  Service: Orthopedics;  Laterality: Left;    Allergies  Allergies  Allergen Reactions   Celebrex [Celecoxib]     dizzy   Hydrocodone     Other reaction(s): Other (See Comments) Nightmares   Hydrocodone-Acetaminophen Other (See Comments)    NIGHTMARES   Vicodin [Hydrocodone-Acetaminophen] Other (See Comments)    NIGHTMARES      Labs/Other Studies Reviewed    The following studies were reviewed today:     Recent Labs: No results found for requested labs within last 365 days.  Recent Lipid Panel    Component Value Date/Time   CHOL 108 (L) 01/22/2016 1056   TRIG 180 (H) 01/22/2016 1056   HDL 37 (L) 01/22/2016 1056   CHOLHDL 2.9 01/22/2016 1056   VLDL 36 (H) 01/22/2016 1056   LDLCALC 35 01/22/2016 1056    History of Present Illness    89 year old male with the above past medical history including CAD s/p DES-LAD in 2002, RBBB, bradycardia, hypertension, hyperlipidemia, TIA, type 2 diabetes, and DJD.  He suffered an MI on Memorial Day weekend in 2002.  He was treated with thrombectomy and stenting to his LAD.  Stress test in 2012 was low risk.  He has remained active and has continued work.  He was last seen in the office on 12/17/2021 and  was stable from a cardiac standpoint.  He denied symptoms concerning for angina.  BP was well-controlled.  He presents today for follow-up accompanied by his daughters.  Since his last visit he has been stable from a cardiac standpoint.  He just returned this past weekend from [place] where he was honored in the anniversary celebration of [place].  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  Upon his return to the [place], he then traveled to [place] for a brief trip.  Since his return, he has noted generalized fatigue.  He also had 1 episode of  palpitations that occurred while lying in bed at night that lasted for approximately 1 minute.  He felt his heart was racing.  He denies any associated symptoms.  He denies any recurrent palpitations, denies chest pain, dyspnea, dizziness, presyncope, syncope, edema, PND, orthopnea, weight gain.  Other than his ongoing fatigue, he reports feeling well.   Home Medications    Current Outpatient Medications  Medication Sig Dispense Refill   amLODipine (NORVASC) 5 MG tablet TAKE 1 TABLET BY MOUTH DAILY 90 tablet 3   aspirin EC 81 MG tablet Take 81 mg by mouth daily before breakfast.      clopidogrel (PLAVIX) 75 MG tablet Take 1 tablet (75 mg total) by mouth daily. 90 tablet 3   isosorbide mononitrate (IMDUR) 30 MG 24 hr tablet TAKE 1 TABLET BY MOUTH DAILY 15 tablet 23   Magnesium 500 MG CAPS Take 1 capsule by mouth daily.      metoprolol succinate (TOPROL-XL) 25 MG 24 hr tablet TAKE ONE-HALF TABLET BY MOUTH  DAILY 45 tablet 3   Multiple Vitamin (MULITIVITAMIN WITH MINERALS) TABS Take 1 tablet by mouth daily.      ramipril (ALTACE) 10 MG capsule TAKE 1 CAPSULE BY MOUTH DAILY 15 capsule 23   rosuvastatin (CRESTOR) 40 MG tablet TAKE 1 TABLET BY MOUTH DAILY 15 tablet 23   TRULICITY 0.75 MG/0.5ML SOPN Inject into the skin.     No current facility-administered medications for this visit.     Review of Systems    He denies chest pain, palpitations, dyspnea, pnd, orthopnea, n, v, dizziness, syncope, edema, weight gain, or early satiety. All other systems reviewed and are otherwise negative except as noted above.   Physical Exam    VS:  BP 124/60 (BP Location: Right Arm, Patient Position: Sitting, Cuff Size: Normal)   Pulse (!) 58   Ht 5\' 6"  (1.676 m)   Wt 174 lb 12.8 oz (79.3 kg)   SpO2 93%   BMI 28.21 kg/m   GEN: Well nourished, well developed, in no acute distress. HEENT: normal. Neck: Supple, no JVD, carotid bruits, or masses. Cardiac: RRR, no murmurs, rubs, or gallops. No clubbing,  cyanosis, edema.  Radials/DP/PT 2+ and equal bilaterally.  Respiratory:  Respirations regular and unlabored, clear to auscultation bilaterally. GI: Soft, nontender, nondistended, BS + x 4. MS: no deformity or atrophy. Skin: warm and dry, no rash. Neuro:  Strength and sensation are intact. Psych: Normal affect.  Accessory Clinical Findings    ECG personally reviewed by me today -sinus bradycardia, 58 bpm, first-degree AV block, RBBB, LAD- no acute changes.   Lab Results  Component Value Date   WBC 4.8 09/04/2016   HGB 12.5 (L) 09/04/2016   HCT 36.3 (L) 09/04/2016   MCV 89.6 09/04/2016   PLT 158 09/04/2016   Lab Results  Component Value Date   CREATININE 1.12  09/04/2016   BUN 19 09/04/2016   NA 132 (L) 09/04/2016   K 4.0 09/04/2016   CL 97 (L) 09/04/2016   CO2 28 09/04/2016   Lab Results  Component Value Date   ALT 43 01/22/2016   AST 48 (H) 01/22/2016   ALKPHOS 122 (H) 01/22/2016   BILITOT 0.8 01/22/2016   Lab Results  Component Value Date   CHOL 108 (L) 01/22/2016   HDL 37 (L) 01/22/2016   LDLCALC 35 01/22/2016   TRIG 180 (H) 01/22/2016   CHOLHDL 2.9 01/22/2016    No results found for: "HGBA1C"  Assessment & Plan    1. Palpitations: Upon returning from a trip to [place] he noted a single episode of palpitations which he described as his heart racing that lasted for less than 1 minute, denies any associated symptoms.  He denies any further palpitations, dizziness, presyncope, syncope.  Continue to monitor symptoms.  Discussed ED precautions.  Continue metoprolol.  2. CAD: S/p DES-LAD in 2002. He does note some increased fatigue since he returned from his trip to [place]. Stable with no anginal symptoms. No indication for ischemic evaluation.  Continue aspirin, Plavix, metoprolol, ramipril, amlodipine, Imdur, and Crestor.   3. Bradycardia/RBBB: Stable on today's EKG.  Continue metoprolol.  4. Hypertension: BP well controlled. Continue current antihypertensive  regimen.   5. Hyperlipidemia: LDL was 41 in 08/2022.  Monitored and managed per PCP.  6. History of TIA: No recurrence.  Continue aspirin, Plavix, Crestor.  7. Type 2 diabetes: A1c was 7.7 in 08/2022.  Monitor managed per PCP.  8. Disposition: Follow-up in 6 to 8 weeks.     Joylene Grapes, NP 02/06/2023, 3:07 PM

## 2023-04-01 NOTE — Progress Notes (Unsigned)
Office Visit    Patient Name: [name] [name] Date of Encounter: 04/02/2023  Primary Care Provider:  Jerl Mina, MD Primary Cardiologist:  Nicki Guadalajara, MD  Chief Complaint    89 year old male with a history of CAD s/p DES-LAD in 2002, RBBB, bradycardia, hypertension, hyperlipidemia, TIA, type 2 diabetes, and DJD who presents for follow-up related to CAD and palpitations.   Past Medical History    Past Medical History:  Diagnosis Date   Arthritis    arthritis in knees    Bradycardia 03/29/2013   CAD S/P percutaneous coronary angioplasty 03/29/2013   AWMI treated with LAD PCI-stent Memorial day 2002 Myioview low risk 2012 (pre op knee)   Cancer (HCC)    hx of basal cell carcinoma    CAP (community acquired pneumonia) 09/03/2016   Coronary artery disease 07/30/11   Nuclear stress test-post-test EF is 45% Lowrisk scan . when compard to previouc study there is no significant change.   Dizziness 09/28/2013   DJD (degenerative joint disease) of knee 05/27/2018   Essential hypertension 03/29/2013   First degree AV block 01/23/2016   GERD (gastroesophageal reflux disease)    Hyperlipidemia with target LDL less than 70 03/29/2013   Hypertension 09/19/10   ECHO-EF53% The transmitral spectral doppler flow pattern suggestive of impairded LV relaxation--likely normal for age with no evidence of ^ LA pressures or LV filling pressures. IVC is dilated, measureing 2.2cm, but unable to assess respirophasic variation.The dilated IVC would suggest ^ CVP/RAP of ~ 10-15 mmHg. RVSP is estimated at 25-35 mmHg consistant with borderline elevated pressures.   Myocardial infarction (HCC)    2002, stent implanted    Non-insulin dependent type 2 diabetes mellitus (HCC) 05/27/2018   Right bundle branch block 01/23/2016   RBBB LAFB, borderline asymptomatic bradycardia    Stroke [facility])    TIA 05/2011    Past Surgical History:  Procedure Laterality Date   CARDIAC CATHETERIZATION     CORONARY ANGIOPLASTY      stent 2002    HERNIA REPAIR     bilateral    TOTAL KNEE ARTHROPLASTY  09/12/2011   Procedure: TOTAL KNEE ARTHROPLASTY;  Surgeon: Javier Docker, MD;  Location: [facility];  Service: Orthopedics;  Laterality: Left;    Allergies  Allergies  Allergen Reactions   Celebrex [Celecoxib]     dizzy   Hydrocodone     Other reaction(s): Other (See Comments) Nightmares   Hydrocodone-Acetaminophen Other (See Comments)    NIGHTMARES   Oxycodone     Other Reaction(s): Confusion   Vicodin [Hydrocodone-Acetaminophen] Other (See Comments)    NIGHTMARES      Labs/Other Studies Reviewed    The following studies were reviewed today:     Recent Labs: No results found for requested labs within last 365 days.  Recent Lipid Panel    Component Value Date/Time   CHOL 108 (L) 01/22/2016 1056   TRIG 180 (H) 01/22/2016 1056   HDL 37 (L) 01/22/2016 1056   CHOLHDL 2.9 01/22/2016 1056   VLDL 36 (H) 01/22/2016 1056   LDLCALC 35 01/22/2016 1056    History of Present Illness    89 year old male with the above past medical history including CAD s/p DES-LAD in 2002, RBBB, bradycardia, hypertension, hyperlipidemia, TIA, type 2 diabetes, and DJD.   He suffered an MI on Memorial Day weekend in 2002.  He was treated with thrombectomy and stenting to his LAD.  Stress test in 2012 was low risk.  He has remained active and  has continued to work.  He is in the office on 02/06/2019 for and was stable from a cardiac standpoint.  He had recently returned from [place], [place] where he was honored in the anniversary celebration of [place].  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  Upon his return to the [place], he then traveled to [place] for a brief trip.  Following his trip he noted generalized fatigue, 1 episode of palpitations that occurred while lying in bed at night.  He felt his heart was racing, symptoms lasted for less than a minute.  He denied symptoms concerning for angina.    He presents today for follow-up accompanied by his daughters.  Since his last visit he has been stable from a cardiac standpoint. He continues to note some generalized fatigue, this has been gradual, he thinks that he is "slowing down."  He denies any chest pain, dyspnea, palpitations, edema, PND, orthopnea, weight gain.  He continues to work 2 jobs.  He also notes some symptoms of postnasal drip, runny nose, and watery eyes.  Other than his ongoing fatigue, he reports feeling well.  Home Medications    Current Outpatient Medications  Medication Sig Dispense Refill   amLODipine (NORVASC) 5 MG tablet TAKE 1 TABLET BY MOUTH DAILY 90 tablet 3   aspirin EC 81 MG tablet Take 81 mg by mouth daily before breakfast.      clopidogrel (PLAVIX) 75 MG tablet Take 1 tablet (75 mg total) by mouth daily. 90 tablet 3   isosorbide mononitrate (IMDUR) 30 MG 24 hr tablet TAKE 1 TABLET BY MOUTH DAILY 15 tablet 23   Magnesium 500 MG CAPS Take 1 capsule by mouth daily.      metoprolol succinate (TOPROL-XL) 25 MG 24 hr tablet TAKE ONE-HALF TABLET BY MOUTH  DAILY 45 tablet 3   Multiple Vitamin (MULITIVITAMIN WITH MINERALS) TABS Take 1 tablet by mouth daily.      ramipril (ALTACE) 10 MG capsule TAKE 1 CAPSULE BY MOUTH DAILY 15 capsule 23   rosuvastatin (CRESTOR) 40 MG tablet TAKE 1 TABLET BY MOUTH DAILY 15 tablet 23   TRULICITY 0.75 MG/0.5ML SOPN Inject into the skin.     No current facility-administered medications for this visit.     Review of Systems    He denies chest pain, palpitations, dyspnea, pnd, orthopnea, n, v, dizziness, syncope, edema, weight gain, or early satiety. All other systems reviewed and are otherwise negative except as noted above.   Physical Exam    VS:  BP 120/68 (BP Location: Left Arm, Patient Position: Sitting, Cuff Size: Normal)   Pulse 65   Ht 5\' 6"  (1.676 m)   Wt 171 lb 9.6 oz (77.8 kg)   SpO2 90%   BMI 27.70 kg/m   GEN: Well nourished, well developed, in no acute  distress. HEENT: normal. Neck: Supple, no JVD, carotid bruits, or masses. Cardiac: RRR, no murmurs, rubs, or gallops. No clubbing, cyanosis, edema.  Radials/DP/PT 2+ and equal bilaterally.  Respiratory:  Respirations regular and unlabored, clear to auscultation bilaterally. GI: Soft, nontender, nondistended, BS + x 4. MS: no deformity or atrophy. Skin: warm and dry, no rash. Neuro:  Strength and sensation are intact. Psych: Normal affect.  Accessory Clinical Findings    ECG personally reviewed by me today -    - no EKG in office today.   Lab Results  Component Value Date   WBC 4.8 09/04/2016   HGB 12.5 (L)  09/04/2016   HCT 36.3 (L) 09/04/2016   MCV 89.6 09/04/2016   PLT 158 09/04/2016   Lab Results  Component Value Date   CREATININE 1.12 09/04/2016   BUN 19 09/04/2016   NA 132 (L) 09/04/2016   K 4.0 09/04/2016   CL 97 (L) 09/04/2016   CO2 28 09/04/2016   Lab Results  Component Value Date   ALT 43 01/22/2016   AST 48 (H) 01/22/2016   ALKPHOS 122 (H) 01/22/2016   BILITOT 0.8 01/22/2016   Lab Results  Component Value Date   CHOL 108 (L) 01/22/2016   HDL 37 (L) 01/22/2016   LDLCALC 35 01/22/2016   TRIG 180 (H) 01/22/2016   CHOLHDL 2.9 01/22/2016    No results found for: "HGBA1C"  Assessment & Plan    1. Palpitations: Upon returning from a trip to [place] he noted a single episode of palpitations which he described as his heart racing that lasted for less than 1 minute, denies any associated symptoms.  He denies any further palpitations, dizziness, presyncope, syncope. Continue metoprolol.   2. CAD/generalized fatigue: S/p DES-LAD in 2002. He does note some increased fatigue since he returned from his trip to [place]. Otherwise, stable with no anginal symptoms.  I advised him to notify us of any progressive symptoms concerning for anginal equivalent, he notes that he would ischemic evaluation if recommended by Dr. Tresa Endo, will defer for now. Continue aspirin, Plavix,  metoprolol, ramipril, amlodipine, Imdur, and Crestor.    3. Bradycardia/RBBB: Stable on today's EKG.  Continue metoprolol.   4. Hypertension: BP well controlled. Continue current antihypertensive regimen.    5. Hyperlipidemia: LDL was 41 in 08/2022.  Monitored and managed per PCP.  Continue Crestor.   6. History of TIA: No recurrence.  Continue aspirin, Plavix, Crestor.   7. Type 2 diabetes: A1c was 7.7 in 08/2022.  Monitored and managed per PCP.  8. Possible seasonal allergies: He reports symptoms of postnasal drip, runny nose, watery eyes.  Suspect this could be related to allergies.  Recommended trial of Xyzal to see if this improves his symptoms.  If symptoms persist, recommend follow-up with PCP.   9. Disposition: Follow-up with Dr. Tresa Endo in 07/2023.       Joylene Grapes, NP 04/02/2023, 2:23 PM

## 2023-04-02 ENCOUNTER — Encounter: Payer: Self-pay | Admitting: Nurse Practitioner

## 2023-04-02 ENCOUNTER — Ambulatory Visit: Payer: Medicare Other | Attending: Nurse Practitioner | Admitting: Nurse Practitioner

## 2023-04-02 VITALS — BP 120/68 | HR 65 | Ht 66.0 in | Wt 171.6 lb

## 2023-04-02 DIAGNOSIS — R5383 Other fatigue: Secondary | ICD-10-CM | POA: Diagnosis not present

## 2023-04-02 DIAGNOSIS — R002 Palpitations: Secondary | ICD-10-CM

## 2023-04-02 DIAGNOSIS — E785 Hyperlipidemia, unspecified: Secondary | ICD-10-CM

## 2023-04-02 DIAGNOSIS — I452 Bifascicular block: Secondary | ICD-10-CM

## 2023-04-02 DIAGNOSIS — I2510 Atherosclerotic heart disease of native coronary artery without angina pectoris: Secondary | ICD-10-CM

## 2023-04-02 DIAGNOSIS — E119 Type 2 diabetes mellitus without complications: Secondary | ICD-10-CM

## 2023-04-02 DIAGNOSIS — I10 Essential (primary) hypertension: Secondary | ICD-10-CM

## 2023-04-02 NOTE — Patient Instructions (Signed)
Medication Instructions:  Your physician recommends that you continue on your current medications as directed. Please refer to the Current Medication list given to you today.  *If you need a refill on your cardiac medications before your next appointment, please call your pharmacy*   Lab Work: NONE ordered at this time of appointment     Testing/Procedures: NONE ordered at this time of appointment     Follow-Up: At [facility], you and your health needs are our priority.  As part of our continuing mission to provide you with exceptional heart care, we have created designated Provider Care Teams.  These Care Teams include your primary Cardiologist (physician) and Advanced Practice Providers (APPs -  Physician Assistants and Nurse Practitioners) who all work together to provide you with the care you need, when you need it.  We recommend signing up for the patient portal called "MyChart".  Sign up information is provided on this After Visit Summary.  MyChart is used to connect with patients for Virtual Visits (Telemedicine).  Patients are able to view lab/test results, encounter notes, upcoming appointments, etc.  Non-urgent messages can be sent to your provider as well.   To learn more about what you can do with MyChart, go to [email or web address].    Your next appointment:   December 2024   Provider:   Nicki Guadalajara, MD  or Bernadene Person, NP

## 2023-07-29 ENCOUNTER — Observation Stay
Admission: EM | Admit: 2023-07-29 | Discharge: 2023-07-30 | Disposition: A | Discharge status: Home / Self Care | Payer: Medicare Other | Attending: Hospitalist | Admitting: Hospitalist

## 2023-07-29 ENCOUNTER — Encounter: Payer: Self-pay | Admitting: Emergency Medicine

## 2023-07-29 ENCOUNTER — Emergency Department: Payer: Medicare Other

## 2023-07-29 ENCOUNTER — Other Ambulatory Visit: Payer: Self-pay

## 2023-07-29 DIAGNOSIS — M6281 Muscle weakness (generalized): Secondary | ICD-10-CM | POA: Insufficient documentation

## 2023-07-29 DIAGNOSIS — R059 Cough, unspecified: Secondary | ICD-10-CM | POA: Insufficient documentation

## 2023-07-29 DIAGNOSIS — B341 Enterovirus infection, unspecified: Secondary | ICD-10-CM | POA: Insufficient documentation

## 2023-07-29 DIAGNOSIS — I700 Atherosclerosis of aorta: Secondary | ICD-10-CM | POA: Insufficient documentation

## 2023-07-29 DIAGNOSIS — D72829 Elevated white blood cell count, unspecified: Secondary | ICD-10-CM | POA: Diagnosis not present

## 2023-07-29 DIAGNOSIS — Z79899 Other long term (current) drug therapy: Secondary | ICD-10-CM | POA: Diagnosis not present

## 2023-07-29 DIAGNOSIS — I10 Essential (primary) hypertension: Secondary | ICD-10-CM | POA: Diagnosis not present

## 2023-07-29 DIAGNOSIS — E785 Hyperlipidemia, unspecified: Secondary | ICD-10-CM | POA: Insufficient documentation

## 2023-07-29 DIAGNOSIS — I2510 Atherosclerotic heart disease of native coronary artery without angina pectoris: Secondary | ICD-10-CM | POA: Insufficient documentation

## 2023-07-29 DIAGNOSIS — B349 Viral infection, unspecified: Secondary | ICD-10-CM | POA: Insufficient documentation

## 2023-07-29 DIAGNOSIS — Z7902 Long term (current) use of antithrombotics/antiplatelets: Secondary | ICD-10-CM | POA: Diagnosis not present

## 2023-07-29 DIAGNOSIS — Z7984 Long term (current) use of oral hypoglycemic drugs: Secondary | ICD-10-CM | POA: Insufficient documentation

## 2023-07-29 DIAGNOSIS — Z7985 Long-term (current) use of injectable non-insulin antidiabetic drugs: Secondary | ICD-10-CM | POA: Insufficient documentation

## 2023-07-29 DIAGNOSIS — Z8673 Personal history of transient ischemic attack (TIA), and cerebral infarction without residual deficits: Secondary | ICD-10-CM | POA: Diagnosis not present

## 2023-07-29 DIAGNOSIS — R2689 Other abnormalities of gait and mobility: Secondary | ICD-10-CM | POA: Insufficient documentation

## 2023-07-29 DIAGNOSIS — Z87891 Personal history of nicotine dependence: Secondary | ICD-10-CM | POA: Diagnosis not present

## 2023-07-29 DIAGNOSIS — B348 Other viral infections of unspecified site: Secondary | ICD-10-CM | POA: Insufficient documentation

## 2023-07-29 DIAGNOSIS — R531 Weakness: Principal | ICD-10-CM | POA: Insufficient documentation

## 2023-07-29 DIAGNOSIS — E119 Type 2 diabetes mellitus without complications: Secondary | ICD-10-CM | POA: Diagnosis not present

## 2023-07-29 DIAGNOSIS — Z7982 Long term (current) use of aspirin: Secondary | ICD-10-CM | POA: Diagnosis not present

## 2023-07-29 LAB — COMPREHENSIVE METABOLIC PANEL
ALT: 22 U/L (ref 0–44)
AST: 26 U/L (ref 15–41)
Albumin: 4 g/dL (ref 3.5–5.0)
Alkaline Phosphatase: 106 U/L (ref 38–126)
Anion gap: 10 (ref 5–15)
BUN: 18 mg/dL (ref 8–23)
CO2: 25 mmol/L (ref 22–32)
Calcium: 9.1 mg/dL (ref 8.9–10.3)
Chloride: 98 mmol/L (ref 98–111)
Creatinine, Ser: 1.17 mg/dL (ref 0.61–1.24)
GFR, Estimated: 56 mL/min — ABNORMAL LOW (ref >60)
Glucose, Bld: 154 mg/dL — ABNORMAL HIGH (ref 70–99)
Potassium: 4.5 mmol/L (ref 3.5–5.1)
Sodium: 133 mmol/L — ABNORMAL LOW (ref 135–145)
Total Bilirubin: 0.5 mg/dL (ref <1.2)
Total Protein: 7.6 g/dL (ref 6.5–8.1)

## 2023-07-29 LAB — URINALYSIS, W/ REFLEX TO CULTURE (INFECTION SUSPECTED)
Bacteria, UA: NONE SEEN
Bilirubin Urine: NEGATIVE
Glucose, UA: NEGATIVE mg/dL
Hgb urine dipstick: NEGATIVE
Ketones, ur: NEGATIVE mg/dL
Leukocytes,Ua: NEGATIVE
Nitrite: NEGATIVE
Protein, ur: NEGATIVE mg/dL
Specific Gravity, Urine: 1.018 (ref 1.005–1.030)
pH: 5 (ref 5.0–8.0)

## 2023-07-29 LAB — CBC WITH DIFFERENTIAL/PLATELET
Abs Immature Granulocytes: 0.05 10*3/uL (ref 0.00–0.07)
Basophils Absolute: 0 10*3/uL (ref 0.0–0.1)
Basophils Relative: 0 %
Eosinophils Absolute: 0 10*3/uL (ref 0.0–0.5)
Eosinophils Relative: 0 %
HCT: 39.5 % (ref 39.0–52.0)
Hemoglobin: 13.1 g/dL (ref 13.0–17.0)
Immature Granulocytes: 1 %
Lymphocytes Relative: 16 %
Lymphs Abs: 1.7 10*3/uL (ref 0.7–4.0)
MCH: 30 pg (ref 26.0–34.0)
MCHC: 33.2 g/dL (ref 30.0–36.0)
MCV: 90.4 fL (ref 80.0–100.0)
Monocytes Absolute: 1.2 10*3/uL — ABNORMAL HIGH (ref 0.1–1.0)
Monocytes Relative: 11 %
Neutro Abs: 7.8 10*3/uL — ABNORMAL HIGH (ref 1.7–7.7)
Neutrophils Relative %: 72 %
Platelets: 184 10*3/uL (ref 150–400)
RBC: 4.37 MIL/uL (ref 4.22–5.81)
RDW: 12.9 % (ref 11.5–15.5)
WBC: 10.8 10*3/uL — ABNORMAL HIGH (ref 4.0–10.5)
nRBC: 0 % (ref 0.0–0.2)

## 2023-07-29 LAB — CBG MONITORING, ED
Glucose-Capillary: 157 mg/dL — ABNORMAL HIGH (ref 70–99)
Glucose-Capillary: 121 mg/dL — ABNORMAL HIGH (ref 70–99)

## 2023-07-29 LAB — PROTIME-INR
INR: 1.1 (ref 0.8–1.2)
Prothrombin Time: 14.4 s (ref 11.4–15.2)

## 2023-07-29 LAB — RESP PANEL BY RT-PCR (RSV, FLU A&B, COVID)  RVPGX2
Influenza A by PCR: NEGATIVE
Influenza B by PCR: NEGATIVE
Resp Syncytial Virus by PCR: NEGATIVE
SARS Coronavirus 2 by RT PCR: NEGATIVE

## 2023-07-29 LAB — LACTIC ACID, PLASMA
Lactic Acid, Venous: 1.3 mmol/L (ref 0.5–1.9)
Lactic Acid, Venous: 1.3 mmol/L (ref 0.5–1.9)

## 2023-07-29 LAB — PHOSPHORUS: Phosphorus: 3.2 mg/dL (ref 2.5–4.6)

## 2023-07-29 LAB — TROPONIN I (HIGH SENSITIVITY): Troponin I (High Sensitivity): 19 ng/L — ABNORMAL HIGH (ref <18)

## 2023-07-29 LAB — MAGNESIUM: Magnesium: 2.1 mg/dL (ref 1.7–2.4)

## 2023-07-29 LAB — TSH: TSH: 0.747 u[IU]/mL (ref 0.350–4.500)

## 2023-07-29 LAB — APTT: aPTT: 31 s (ref 24–36)

## 2023-07-29 LAB — PROCALCITONIN: Procalcitonin: 0.11 ng/mL

## 2023-07-29 MED ORDER — MAGNESIUM OXIDE -MG SUPPLEMENT 400 (240 MG) MG PO TABS
400.0000 mg | ORAL_TABLET | Freq: Every day | ORAL | Status: DC
  Administered 2023-07-29 – 2023-07-30 (×2): 400 mg via ORAL
  Filled 2023-07-29 (×2): qty 1

## 2023-07-29 MED ORDER — ENSURE ENLIVE PO LIQD
237.0000 mL | Freq: Two times a day (BID) | ORAL | Status: DC
  Administered 2023-07-30: 237 mL via ORAL

## 2023-07-29 MED ORDER — INSULIN ASPART 100 UNIT/ML IJ SOLN
0.0000 [IU] | Freq: Three times a day (TID) | INTRAMUSCULAR | Status: DC
  Administered 2023-07-29: 3 [IU] via SUBCUTANEOUS
  Administered 2023-07-30: 2 [IU] via SUBCUTANEOUS
  Filled 2023-07-29 (×2): qty 1

## 2023-07-29 MED ORDER — LACTATED RINGERS IV BOLUS (SEPSIS)
1000.0000 mL | Freq: Once | INTRAVENOUS | Status: AC
  Administered 2023-07-29: 1000 mL via INTRAVENOUS

## 2023-07-29 MED ORDER — SENNOSIDES-DOCUSATE SODIUM 8.6-50 MG PO TABS
1.0000 | ORAL_TABLET | Freq: Every evening | ORAL | Status: DC | PRN
Start: 2023-07-29 — End: 2023-07-30

## 2023-07-29 MED ORDER — ENOXAPARIN SODIUM 40 MG/0.4ML IJ SOSY
40.0000 mg | PREFILLED_SYRINGE | INTRAMUSCULAR | Status: DC
  Administered 2023-07-29: 40 mg via SUBCUTANEOUS
  Filled 2023-07-29: qty 0.4

## 2023-07-29 MED ORDER — ROSUVASTATIN CALCIUM 20 MG PO TABS
40.0000 mg | ORAL_TABLET | Freq: Every day | ORAL | Status: DC
  Administered 2023-07-29 – 2023-07-30 (×2): 40 mg via ORAL
  Filled 2023-07-29 (×2): qty 2

## 2023-07-29 MED ORDER — ONDANSETRON HCL 4 MG PO TABS: 4.0000 mg | ORAL_TABLET | Freq: Four times a day (QID) | ORAL | Status: DC | PRN

## 2023-07-29 MED ORDER — ISOSORBIDE MONONITRATE ER 60 MG PO TB24
30.0000 mg | ORAL_TABLET | Freq: Every day | ORAL | Status: DC
  Administered 2023-07-29: 30 mg via ORAL
  Filled 2023-07-29 (×2): qty 1

## 2023-07-29 MED ORDER — CEFTRIAXONE SODIUM 2 G IJ SOLR
2.0000 g | INTRAMUSCULAR | Status: DC
  Administered 2023-07-29: 2 g via INTRAVENOUS
  Filled 2023-07-29: qty 20

## 2023-07-29 MED ORDER — SODIUM CHLORIDE 0.9% FLUSH: 10.0000 mL | Freq: Two times a day (BID) | INTRAVENOUS | Status: DC

## 2023-07-29 MED ORDER — ONDANSETRON HCL 4 MG/2ML IJ SOLN: 4.0000 mg | Freq: Four times a day (QID) | INTRAMUSCULAR | Status: DC | PRN

## 2023-07-29 MED ORDER — AMLODIPINE BESYLATE 5 MG PO TABS
5.0000 mg | ORAL_TABLET | Freq: Every day | ORAL | Status: DC
  Administered 2023-07-29: 5 mg via ORAL
  Filled 2023-07-29 (×2): qty 1

## 2023-07-29 MED ORDER — DM-GUAIFENESIN ER 30-600 MG PO TB12
1.0000 | ORAL_TABLET | Freq: Two times a day (BID) | ORAL | Status: DC
  Administered 2023-07-29 – 2023-07-30 (×3): 1 via ORAL
  Filled 2023-07-29 (×3): qty 1

## 2023-07-29 MED ORDER — RAMIPRIL 5 MG PO CAPS
10.0000 mg | ORAL_CAPSULE | Freq: Every day | ORAL | Status: DC
Start: 2023-07-29 — End: 2023-07-30
  Administered 2023-07-29: 10 mg via ORAL
  Filled 2023-07-29: qty 1
  Filled 2023-07-29 (×2): qty 2

## 2023-07-29 MED ORDER — METOPROLOL SUCCINATE ER 25 MG PO TB24
12.5000 mg | ORAL_TABLET | Freq: Every day | ORAL | Status: DC
  Administered 2023-07-29: 12.5 mg via ORAL
  Filled 2023-07-29 (×2): qty 1

## 2023-07-29 MED ORDER — SODIUM CHLORIDE 0.9 % IV SOLN: INTRAVENOUS | Status: DC

## 2023-07-29 MED ORDER — ADULT MULTIVITAMIN W/MINERALS CH
1.0000 | ORAL_TABLET | Freq: Every day | ORAL | Status: DC
Start: 2023-07-29 — End: 2023-07-30
  Administered 2023-07-29 – 2023-07-30 (×2): 1 via ORAL
  Filled 2023-07-29 (×2): qty 1

## 2023-07-29 MED ORDER — ASPIRIN 81 MG PO TBEC
81.0000 mg | DELAYED_RELEASE_TABLET | Freq: Every day | ORAL | Status: DC
  Administered 2023-07-30: 81 mg via ORAL
  Filled 2023-07-29: qty 1

## 2023-07-29 NOTE — ED Provider Notes (Signed)
[facility] Provider Note    Event Date/Time   First MD Initiated Contact with Patient 07/29/23 1028     (approximate)   History   Weakness   HPI  [name] [name] is a 89 y.o. male past medical history significant for coronary artery disease, hypertension, GERD, TIA, who presents to the emergency department with generalized weakness and not feeling well.  Patient states that he developed a cough and congestion over the past couple of days.  States that it worsened over the past 12 hours.  States that this morning he was unable to get out of bed secondary to generalized weakness in both of his legs.  Denies any falls or head trauma.  Denies dysuria, urinary urgency or frequency.  Does endorse a history of urinary tract infection in the past.  Denies any chest pain.  Does endorse a productive cough.  Denies fever.  No history of DVT or PE.     Physical Exam   Triage Vital Signs: ED Triage Vitals  Encounter Vitals Group     BP 07/29/23 1027 123/62     Systolic BP Percentile --      Diastolic BP Percentile --      Pulse Rate 07/29/23 1027 77     Resp 07/29/23 1027 20     Temp 07/29/23 1027 99 F (37.2 C)     Temp Source 07/29/23 1027 Oral     SpO2 07/29/23 1027 94 %     Weight 07/29/23 1019 180 lb (81.6 kg)     Height 07/29/23 1019 5\' 6"  (1.676 m)     Head Circumference --      Peak Flow --      Pain Score 07/29/23 1019 0     Pain Loc --      Pain Education --      Exclude from Growth Chart --     Most recent vital signs: Vitals:   07/29/23 1525 07/29/23 1530  BP: 122/63 (!) 136/98  Pulse: 69 77  Resp: 16 (!) 25  Temp:    SpO2: 93% 93%    Physical Exam Constitutional:      Appearance: He is well-developed.  HENT:     Head: Atraumatic.  Eyes:     Conjunctiva/sclera: Conjunctivae normal.  Cardiovascular:     Rate and Rhythm: Regular rhythm.  Pulmonary:     Effort: No respiratory distress.     Breath sounds: Rhonchi present.      Comments: Tachypneic, speaking in full sentences, coarse breath sounds throughout all lung fields Musculoskeletal:     Cervical back: Normal range of motion.  Skin:    General: Skin is warm.  Neurological:     Mental Status: He is alert. Mental status is at baseline.     IMPRESSION / MDM / ASSESSMENT AND PLAN / ED COURSE  I reviewed the triage vital signs and the nursing notes.  Differential diagnosis including sepsis, urinary tract infection, pneumonia, COVID, heart failure, ACS  EKG  I, Corena Herter, the attending physician, personally viewed and interpreted this ECG.   Rate: Normal  Rhythm: Normal sinus  Intervals: Normal  ST&T Change: None  No tachycardic or bradycardic dysrhythmias while on cardiac telemetry.  RADIOLOGY I independently reviewed imaging, my interpretation of imaging: CXR w/o signs of edema or PNA>   LABS (all labs ordered are listed, but only abnormal results are displayed) Labs interpreted as -    Labs Reviewed  COMPREHENSIVE METABOLIC PANEL -  Abnormal; Notable for the following components:      Result Value   Sodium 133 (*)    Glucose, Bld 154 (*)    GFR, Estimated 56 (*)    All other components within normal limits  CBC WITH DIFFERENTIAL/PLATELET - Abnormal; Notable for the following components:   WBC 10.8 (*)    Neutro Abs 7.8 (*)    Monocytes Absolute 1.2 (*)    All other components within normal limits  URINALYSIS, W/ REFLEX TO CULTURE (INFECTION SUSPECTED) - Abnormal; Notable for the following components:   Color, Urine TEST WILL BE CREDITED (*)    APPearance TEST WILL BE CREDITED (*)    Glucose, UA TEST WILL BE CREDITED (*)    Hgb urine dipstick TEST WILL BE CREDITED (*)    Bilirubin Urine TEST WILL BE CREDITED (*)    Ketones, ur TEST WILL BE CREDITED (*)    Protein, ur TEST WILL BE CREDITED (*)    Nitrite TEST WILL BE CREDITED (*)    Leukocytes,Ua TEST WILL BE CREDITED (*)    Bacteria, UA TEST WILL BE CREDITED (*)    All other  components within normal limits  TROPONIN I (HIGH SENSITIVITY) - Abnormal; Notable for the following components:   Troponin I (High Sensitivity) 19 (*)    All other components within normal limits  RESP PANEL BY RT-PCR (RSV, FLU A&B, COVID)  RVPGX2  CULTURE, BLOOD (ROUTINE X 2)  CULTURE, BLOOD (ROUTINE X 2)  RESPIRATORY PANEL BY PCR  LACTIC ACID, PLASMA  LACTIC ACID, PLASMA  PROTIME-INR  APTT  MAGNESIUM  PHOSPHORUS  PROCALCITONIN  TSH  URINALYSIS, W/ REFLEX TO CULTURE (INFECTION SUSPECTED)     MDM  Blood cultures obtained.  Concern for possible underlying infectious process.  Given 1 L of IV fluids and will reevaluate, do not feel that 30 cc/kg of IV fluids are necessary at this time.  Urine concerning for urinary tract infection so was given IV Rocephin.  Mild leukocytosis.  Does have tachypnea.  COVID and influenza testing are negative.  Lab work overall unremarkable.  Mildly elevated troponin.  Notified by lab urine was not correct.  Patient will still need admission for generalized weakness, dehydration.  Consulted hospitalist for admission and informed him of the urine studies.  Clinical Course as of 07/29/23 1611  Wed Jul 29, 2023  1256 Called and notified from lab that the patient's urine that was ran was not actually the patient's.  Still waiting on a urine sample.  Patient will still need admitted for generalized weakness. [SM]    Clinical Course User Index [SM] Corena Herter, MD     PROCEDURES:  Critical Care performed: No  Procedures  Patient's presentation is most consistent with acute presentation with potential threat to life or bodily function.   MEDICATIONS ORDERED IN ED: Medications  sodium chloride flush (NS) 0.9 % injection 10 mL (10 mLs Intravenous Not Given 07/29/23 1203)  enoxaparin (LOVENOX) injection 40 mg (40 mg Subcutaneous Given 07/29/23 1528)  senna-docusate (Senokot-S) tablet 1 tablet (has no administration in time range)  ondansetron  (ZOFRAN) tablet 4 mg (has no administration in time range)    Or  ondansetron (ZOFRAN) injection 4 mg (has no administration in time range)  feeding supplement (ENSURE ENLIVE / ENSURE PLUS) liquid 237 mL (0 mLs Oral Hold 07/29/23 1341)  0.9 %  sodium chloride infusion ( Intravenous New Bag/Given 07/29/23 1336)  amLODipine (NORVASC) tablet 5 mg (5 mg Oral Given 07/29/23 1525)  aspirin EC tablet 81 mg (has no administration in time range)  isosorbide mononitrate (IMDUR) 24 hr tablet 30 mg (30 mg Oral Given 07/29/23 1524)  magnesium oxide (MAG-OX) tablet 400 mg (400 mg Oral Given 07/29/23 1526)  metoprolol succinate (TOPROL-XL) 24 hr tablet 12.5 mg (12.5 mg Oral Given 07/29/23 1526)  multivitamin with minerals tablet 1 tablet (1 tablet Oral Given 07/29/23 1525)  ramipril (ALTACE) capsule 10 mg (10 mg Oral Given 07/29/23 1525)  rosuvastatin (CRESTOR) tablet 40 mg (40 mg Oral Given 07/29/23 1525)  dextromethorphan-guaiFENesin (MUCINEX DM) 30-600 MG per 12 hr tablet 1 tablet (1 tablet Oral Given 07/29/23 1526)  insulin aspart (novoLOG) injection 0-15 Units (has no administration in time range)  lactated ringers bolus 1,000 mL (0 mLs Intravenous Stopped 07/29/23 1240)    FINAL CLINICAL IMPRESSION(S) / ED DIAGNOSES   Final diagnoses:  Weakness     Rx / DC Orders   ED Discharge Orders     None        Note:  This document was prepared using Dragon voice recognition software and may include unintentional dictation errors.   Corena Herter, MD 07/29/23 [phone]

## 2023-07-29 NOTE — ED Triage Notes (Signed)
First Nurse Note;  Pt via POV from [facility]. Pt c/o generalized weakness, hypoxia, cough and nasal congestion for the past couple of days. Pt is A&Ox4 and NAD

## 2023-07-29 NOTE — Consult Note (Signed)
CODE SEPSIS - PHARMACY COMMUNICATION  **Broad Spectrum Antibiotics should be administered within 1 hour of Sepsis diagnosis**  Time Code Sepsis Called/Page Received: 1127   Antibiotics Ordered: Ceftriaxone  Time of 1st antibiotic administration: 1201  Additional action taken by pharmacy: N/A  If necessary, Name of Provider/Nurse Contacted: N/A  Littie Deeds, PharmD Pharmacy Resident  07/29/2023 11:31 AM

## 2023-07-29 NOTE — ED Triage Notes (Signed)
Pt via POV from [facility]. Pt c/o congestion, cough, generalized weakness for the past couple of days. Denies any fever. Denies SOB. Denies pain. Pt has been around some family members that have been sick. Pt is A&Ox4 and NAD KC reports that his initially 90% on RA, on arrival pt 96% on RA.

## 2023-07-29 NOTE — Sepsis Progress Note (Signed)
Code Sepsis protocol being monitored by eLink. 

## 2023-07-29 NOTE — ED Notes (Signed)
Lab called at this time to inform RN that urine collection/results noted in chart where not patient's results. Mumma, MD made aware. Lab to place new urine order.

## 2023-07-29 NOTE — H&P (Signed)
History and Physical    Patient: [name] [name] ZOX:[id number] DOB: 1921-05-28 DOA: 07/29/2023 DOS: the patient was seen and examined on 07/29/2023 PCP: Jerl Mina, MD  Patient coming from: Home  Chief Complaint:  Chief Complaint  Patient presents with   Weakness   HPI: [name] [name] is a 89 y.o. male WWII veteran with medical history significant of HTN, HLD CAD, GERD, RBBB, T2DM and CVA who presented to the ED for evaluation of generalized weakness. Patient states that over the last week, he has had a congested cough productive with yellow sputum. Over the last few days, he has become very weak and had trouble getting out of bed this morning. He denies any fevers, chills, shortness of breath, chest pain, dizziness, headache, dysuria, hematuria, abdominal pain, nausea, vomiting or bloody stools. Per spouse, patient has been eating okay but has not been drinking enough water.   ED course: Initial vitals with temp 99, RR 24, HR 69, BP 131/62, SpO2 93% on room air. Labs show sodium 133, K+ 4.5, creatinine 1.17, albumin 4.0, WBC 10.8, Hgb 13.1, platelet 184, lactic acid 1.2, PT/INR 14.4/1.1, troponin 19, negative flu and COVID test. CXR showed low lung volumes but no acute cardiopulmonary disease. Patient was given IV Rocephin and 1 L IVLR bolus TRH was consulted for admission  Review of Systems: As mentioned in the history of present illness. All other systems reviewed and are negative. Past Medical History:  Diagnosis Date   Arthritis    arthritis in knees    Bradycardia 03/29/2013   CAD S/P percutaneous coronary angioplasty 03/29/2013   AWMI treated with LAD PCI-stent [facility] 2002 Myioview low risk 2012 (pre op knee)   Cancer (HCC)    hx of basal cell carcinoma    CAP (community acquired pneumonia) 09/03/2016   Coronary artery disease 07/30/11   Nuclear stress test-post-test EF is 45% Lowrisk scan . when compard to previouc study there is no significant change.   Dizziness  09/28/2013   DJD (degenerative joint disease) of knee 05/27/2018   Essential hypertension 03/29/2013   First degree AV block 01/23/2016   GERD (gastroesophageal reflux disease)    Hyperlipidemia with target LDL less than 70 03/29/2013   Hypertension 09/19/10   ECHO-EF53% The transmitral spectral doppler flow pattern suggestive of impairded LV relaxation--likely normal for age with no evidence of ^ LA pressures or LV filling pressures. IVC is dilated, measureing 2.2cm, but unable to assess respirophasic variation.The dilated IVC would suggest ^ CVP/RAP of ~ 10-15 mmHg. RVSP is estimated at 25-35 mmHg consistant with borderline elevated pressures.   Myocardial infarction (HCC)    2002, stent implanted    Non-insulin dependent type 2 diabetes mellitus (HCC) 05/27/2018   Right bundle branch block 01/23/2016   RBBB LAFB, borderline asymptomatic bradycardia    Stroke [facility])    TIA 05/2011    Past Surgical History:  Procedure Laterality Date   CARDIAC CATHETERIZATION     CORONARY ANGIOPLASTY     stent 2002    HERNIA REPAIR     bilateral    TOTAL KNEE ARTHROPLASTY  09/12/2011   Procedure: TOTAL KNEE ARTHROPLASTY;  Surgeon: Javier Docker, MD;  Location: [facility];  Service: Orthopedics;  Laterality: Left;   Social History:  reports that he quit smoking about 72 years ago. His smoking use included cigarettes. He has never used smokeless tobacco. He reports that he does not drink alcohol and does not use drugs.  Allergies  Allergen Reactions  Celebrex [Celecoxib]     dizzy   Hydrocodone     Other reaction(s): Other (See Comments) Nightmares   Hydrocodone-Acetaminophen Other (See Comments)    NIGHTMARES   Oxycodone     Other Reaction(s): Confusion   Vicodin [Hydrocodone-Acetaminophen] Other (See Comments)    NIGHTMARES     Family History  Problem Relation Age of Onset   COPD Father     Prior to Admission medications   Medication Sig Start Date End Date Taking? Authorizing Provider   amLODipine (NORVASC) 5 MG tablet TAKE 1 TABLET BY MOUTH DAILY 10/10/22   Lennette Bihari, MD  aspirin EC 81 MG tablet Take 81 mg by mouth daily before breakfast.     [provider]  clopidogrel (PLAVIX) 75 MG tablet Take 1 tablet (75 mg total) by mouth daily. 12/17/21   Ronney Asters, NP  isosorbide mononitrate (IMDUR) 30 MG 24 hr tablet TAKE 1 TABLET BY MOUTH DAILY 01/21/23   Lennette Bihari, MD  Magnesium 500 MG CAPS Take 1 capsule by mouth daily.     [provider]  metoprolol succinate (TOPROL-XL) 25 MG 24 hr tablet TAKE ONE-HALF TABLET BY MOUTH  DAILY 02/02/23   Ronney Asters, NP  Multiple Vitamin (MULITIVITAMIN WITH MINERALS) TABS Take 1 tablet by mouth daily.     [provider]  ramipril (ALTACE) 10 MG capsule TAKE 1 CAPSULE BY MOUTH DAILY 01/21/23   Lennette Bihari, MD  rosuvastatin (CRESTOR) 40 MG tablet TAKE 1 TABLET BY MOUTH DAILY 01/21/23   Lennette Bihari, MD  TRULICITY 0.75 MG/0.5ML SOPN Inject into the skin. 11/24/21   [provider]    Physical Exam: Vitals:   07/29/23 1100 07/29/23 1200 07/29/23 1230 07/29/23 1330  BP: 131/62 128/60 132/71 137/70  Pulse: 69 65 66 83  Resp: (!) 24 (!) 25 (!) 23 20  Temp:      TempSrc:      SpO2: 93% 94% 94% 91%  Weight:      Height:       General: Pleasant, well-appearing elderly man laying in bed. No acute distress. HEENT: [place]/AT. Anicteric sclera.  Dry mucous membrane CV: RRR. No murmurs, rubs, or gallops. No LE edema Pulmonary: Lungs CTAB. Normal effort. Rhonchorous lung sounds. No wheezing or rales. Abdominal: Soft, nontender, nondistended. Normal bowel sounds. Extremities: Palpable radial and DP pulses. Normal ROM. Skin: Warm and dry. No obvious rash or lesions.  Decreased skin turgor. Neuro: A&Ox3. Moves all extremities. Normal sensation to light touch. No focal deficit. Psych: Normal mood and affect  Data Reviewed: Labs show sodium 133, K+ 4.5, creatinine 1.17, albumin 4.0, WBC 10.8, Hgb  13.1, platelet 184, lactic acid 1.2, PT/INR 14.4/1.1, troponin 19, negative flu and COVID test. CXR showed low lung volumes but no acute cardiopulmonary disease. EKG showed normal sinus with RBBB and LAFB unchanged from EKG in June 2024  Assessment and Plan: [name] [name] is a 89 y.o. male veteran with medical history significant of HTN, HLD, RBBB, CAD, GERD, T2DM and CVA who presented to the ED for evaluation of generalized weakness and admitted for further evaluation.  # Generalized weakness Elderly patient with history of chronic fatigue presented with worsening weakness over the last few days. His worsening weakness likely combination of his recent decreased fluid intake as well as possible URI/pneumonia. Will admit for observation with IV fluid and consult PT. S/p 1 L IV LR bolus in the ED. He remains hemodynamically stable. -IV NS at  100 cc/h for 1 day -Check TSH, mag and phos -PT/OT eval and treat -Start Ensure twice daily between meals -Registered dietitian consulted, appreciate recs  # Cough # Leukocytosis Patient reports he has had a congested cough for about a week. States he is also had sick exposure at home. He denies any fevers, chills, dysuria, chest pain or shortness of breath. CXR without evidence of pneumonia. COVID and flu negative. Lactate  wnl. WBC minimally elevated to 10.8. He does sound rhonchorous on lung auscultation however his mild leukocytosis could be likely due to hemoconcentrate on labs versus reactive. A URI also possible. S/p IV Rocephin in the ED. Will hold off on further antibiotics and follow-up for procalcitonin. -Start Mucinex DM twice daily -Check full respiratory viral panel -Follow-up procalcitonin, urinalysis and blood culture -Trend CBC, fever curve  # T2DM Last A1c 7.7% in July 2024.  On Trulicity 0.75 mg once weekly on Tuesdays.  Blood sugar 154 and CMP. -Follow-up repeat A1c -SSI with meals, CBG monitoring  # HTN BP stable with SBP in the  120s to 130s. -Continue Imdur, amlodipine ramipril and Toprol-XL  # HLD # CAD # Hx of CVA CAD status post stents to LAD in 2002. He denies any chest pain or palpitations. -Continue aspirin, Plavix and Crestor    Advance Care Planning:   Code Status: Full Code   Consults: None  Family Communication: Discussed admission with spouse at bedside  Severity of Illness: The appropriate patient status for this patient is OBSERVATION. Observation status is judged to be reasonable and necessary in order to provide the required intensity of service to ensure the patient's safety. The patient's presenting symptoms, physical exam findings, and initial radiographic and laboratory data in the context of their medical condition is felt to place them at decreased risk for further clinical deterioration. Furthermore, it is anticipated that the patient will be medically stable for discharge from the hospital within 2 midnights of admission.   Author: Steffanie Rainwater, MD 07/29/2023 1:54 PM  For on call review [email or web address].

## 2023-07-30 DIAGNOSIS — R531 Weakness: Secondary | ICD-10-CM | POA: Diagnosis not present

## 2023-07-30 LAB — CBC
HCT: 32.3 % — ABNORMAL LOW (ref 39.0–52.0)
Hemoglobin: 10.9 g/dL — ABNORMAL LOW (ref 13.0–17.0)
MCH: 30.4 pg (ref 26.0–34.0)
MCHC: 33.7 g/dL (ref 30.0–36.0)
MCV: 90.2 fL (ref 80.0–100.0)
Platelets: 162 10*3/uL (ref 150–400)
RBC: 3.58 MIL/uL — ABNORMAL LOW (ref 4.22–5.81)
RDW: 13 % (ref 11.5–15.5)
WBC: 8.2 10*3/uL (ref 4.0–10.5)
nRBC: 0 % (ref 0.0–0.2)

## 2023-07-30 LAB — BASIC METABOLIC PANEL
Anion gap: 7 (ref 5–15)
BUN: 15 mg/dL (ref 8–23)
CO2: 24 mmol/L (ref 22–32)
Calcium: 8.4 mg/dL — ABNORMAL LOW (ref 8.9–10.3)
Chloride: 102 mmol/L (ref 98–111)
Creatinine, Ser: 1.01 mg/dL (ref 0.61–1.24)
GFR, Estimated: >60 mL/min (ref >60)
Glucose, Bld: 136 mg/dL — ABNORMAL HIGH (ref 70–99)
Potassium: 4 mmol/L (ref 3.5–5.1)
Sodium: 133 mmol/L — ABNORMAL LOW (ref 135–145)

## 2023-07-30 LAB — RESPIRATORY PANEL BY PCR

## 2023-07-30 LAB — HEMOGLOBIN A1C
Hgb A1c MFr Bld: 7.2 % — ABNORMAL HIGH (ref 4.8–5.6)
Mean Plasma Glucose: 159.94 mg/dL

## 2023-07-30 LAB — CBG MONITORING, ED: Glucose-Capillary: 141 mg/dL — ABNORMAL HIGH (ref 70–99)

## 2023-07-30 NOTE — Discharge Summary (Signed)
Physician Discharge Summary   [name] TARRENCE  male DOB: May 19, 1921  WUJ:[id number]  PCP: Jerl Mina, MD  Admit date: 07/29/2023 Discharge date: 07/30/2023  Admitted From: home Disposition:  home Family updated at bedside prior to discharge.  CODE STATUS: Full code  Discharge Instructions     Discharge instructions   Complete by: As directed    Your blood pressure was on the low side, so I have stopped your amlodipine. [facility] Course:  For full details, please see H&P, progress notes, consult notes and ancillary notes.  Briefly,  [name] [name] is a 89 y.o. male WWII veteran with medical history significant of HTN, CAD, T2DM and CVA who presented to the ED for evaluation of generalized weakness.   Patient states that over the last week, he has had a congested cough productive with yellow sputum. Over the last few days, he has become very weak and had trouble getting out of bed morning of presentation.   # Cough and Generalized weakness 2/2 Rhinovirus/Enterovirus infection --CXR no acute finding.  Symptoms consistent with URI.  No hypoxia.  Pt worked with PT and was found to be at his baseline and safe to go home.     # T2DM --A1c 7.2.  Cont home Trulicity.   # HTN BP stable with SBP in the 120s to 130s. -Continue Imdur, ramipril and Toprol-XL --d/c home amlodipine since BP was low normal with current BP regimen.   # HLD # CAD # Hx of CVA CAD status post stents to LAD in 2002. He denies any chest pain or palpitations. -Continue aspirin and Crestor   Discharge Diagnoses:  Principal Problem:   Generalized weakness     Discharge Instructions:  Allergies as of 07/30/2023       Reactions   Celebrex [celecoxib]    dizzy   Hydrocodone    Other reaction(s): Other (See Comments) Nightmares   Hydrocodone-acetaminophen Other (See Comments)   NIGHTMARES   Oxycodone    Other Reaction(s): Confusion   Vicodin [hydrocodone-acetaminophen] Other  (See Comments)   NIGHTMARES        Medication List     STOP taking these medications    amLODipine 5 MG tablet Commonly known as: NORVASC       TAKE these medications    aspirin EC 81 MG tablet Take 81 mg by mouth daily before breakfast.   isosorbide mononitrate 30 MG 24 hr tablet Commonly known as: IMDUR TAKE 1 TABLET BY MOUTH DAILY   Magnesium 500 MG Caps Take 1 capsule by mouth daily.   metoprolol succinate 25 MG 24 hr tablet Commonly known as: TOPROL-XL TAKE ONE-HALF TABLET BY MOUTH  DAILY   multivitamin with minerals Tabs tablet Take 1 tablet by mouth daily.   ramipril 10 MG capsule Commonly known as: ALTACE TAKE 1 CAPSULE BY MOUTH DAILY   rosuvastatin 40 MG tablet Commonly known as: CRESTOR TAKE 1 TABLET BY MOUTH DAILY   Trulicity 0.75 MG/0.5ML Sopn Inject 0.75 mg into the skin once a week.         Follow-up Information     Jerl Mina, MD Follow up in 1 week(s).   Specialty: Family Medicine Contact information: [address] [place] [address] [phone]                 Allergies  Allergen Reactions   Celebrex [Celecoxib]     dizzy   Hydrocodone  Other reaction(s): Other (See Comments) Nightmares   Hydrocodone-Acetaminophen Other (See Comments)    NIGHTMARES   Oxycodone     Other Reaction(s): Confusion   Vicodin [Hydrocodone-Acetaminophen] Other (See Comments)    NIGHTMARES      The results of significant diagnostics from this hospitalization (including imaging, microbiology, ancillary and laboratory) are listed below for reference.   Consultations:   Procedures/Studies: DG Chest Port 1 View  Result Date: 07/29/2023 CLINICAL DATA:  89 year old male with possible sepsis. EXAM: PORTABLE CHEST 1 VIEW COMPARISON:  Chest radiographs 09/03/2016 and earlier. FINDINGS: Portable AP view at 1141 hours. More lordotic positioning. Mildly lower lung volumes. Calcified aortic atherosclerosis.  Mediastinal contours have not significantly changed, cardiac size within normal limits. Visualized tracheal air column is within normal limits. Allowing for portable technique the lungs are clear. No pneumothorax. No convincing pleural effusion. Chronic postoperative changes to the right humerus. No acute osseous abnormality identified. IMPRESSION: Lower lung volumes.  No acute cardiopulmonary abnormality. Electronically Signed   By: Odessa Fleming M.D.   On: 07/29/2023 11:57      Labs: BNP (last 3 results) No results for input(s): "BNP" in the last 8760 hours. Basic Metabolic Panel: Recent Labs  Lab 07/29/23 1048 07/29/23 1334 07/30/23 0515  NA 133*  --  133*  K 4.5  --  4.0  CL 98  --  102  CO2 25  --  24  GLUCOSE 154*  --  136*  BUN 18  --  15  CREATININE 1.17  --  1.01  CALCIUM 9.1  --  8.4*  MG  --  2.1  --   PHOS  --  3.2  --    Liver Function Tests: Recent Labs  Lab 07/29/23 1048  AST 26  ALT 22  ALKPHOS 106  BILITOT 0.5  PROT 7.6  ALBUMIN 4.0   No results for input(s): "LIPASE", "AMYLASE" in the last 168 hours. No results for input(s): "AMMONIA" in the last 168 hours. CBC: Recent Labs  Lab 07/29/23 1048 07/30/23 0515  WBC 10.8* 8.2  NEUTROABS 7.8*  --   HGB 13.1 10.9*  HCT 39.5 32.3*  MCV 90.4 90.2  PLT 184 162   Cardiac Enzymes: No results for input(s): "CKTOTAL", "CKMB", "CKMBINDEX", "TROPONINI" in the last 168 hours. BNP: Invalid input(s): "POCBNP" CBG: Recent Labs  Lab 07/29/23 1627 07/29/23 2252 07/30/23 0842  GLUCAP 157* 121* 141*   D-Dimer No results for input(s): "DDIMER" in the last 72 hours. Hgb A1c Recent Labs    07/30/23 0515  HGBA1C 7.2*   Lipid Profile No results for input(s): "CHOL", "HDL", "LDLCALC", "TRIG", "CHOLHDL", "LDLDIRECT" in the last 72 hours. Thyroid function studies Recent Labs    07/29/23 1334  TSH 0.747   Anemia work up No results for input(s): "VITAMINB12", "FOLATE", "FERRITIN", "TIBC", "IRON", "RETICCTPCT"  in the last 72 hours. Urinalysis    Component Value Date/Time   COLORURINE YELLOW (A) 07/29/2023 1816   APPEARANCEUR CLEAR (A) 07/29/2023 1816   APPEARANCEUR Clear 09/17/2011 0831   LABSPEC 1.018 07/29/2023 1816   LABSPEC 1.016 09/17/2011 0831   PHURINE 5.0 07/29/2023 1816   GLUCOSEU NEGATIVE 07/29/2023 1816   GLUCOSEU Negative 09/17/2011 0831   HGBUR NEGATIVE 07/29/2023 1816   BILIRUBINUR NEGATIVE 07/29/2023 1816   BILIRUBINUR Negative 09/17/2011 0831   KETONESUR NEGATIVE 07/29/2023 1816   PROTEINUR NEGATIVE 07/29/2023 1816   UROBILINOGEN 0.2 09/04/2011 0942   NITRITE NEGATIVE 07/29/2023 1816   LEUKOCYTESUR NEGATIVE 07/29/2023 1816   LEUKOCYTESUR Negative  09/17/2011 0831   Sepsis Labs Recent Labs  Lab 07/29/23 1048 07/30/23 0515  WBC 10.8* 8.2   Microbiology Recent Results (from the past 240 hour(s))  Resp panel by RT-PCR (RSV, Flu A&B, Covid) Anterior Nasal Swab     Status: None   Collection Time: 07/29/23 10:52 AM   Specimen: Anterior Nasal Swab  Result Value Ref Range Status   SARS Coronavirus 2 by RT PCR NEGATIVE NEGATIVE Final    Comment: (NOTE) SARS-CoV-2 target nucleic acids are NOT DETECTED.  The SARS-CoV-2 RNA is generally detectable in upper respiratory specimens during the acute phase of infection. The lowest concentration of SARS-CoV-2 viral copies this assay can detect is 138 copies/mL. A negative result does not preclude SARS-Cov-2 infection and should not be used as the sole basis for treatment or other patient management decisions. A negative result may occur with  improper specimen collection/handling, submission of specimen other than nasopharyngeal swab, presence of viral mutation(s) within the areas targeted by this assay, and inadequate number of viral copies(<138 copies/mL). A negative result must be combined with clinical observations, patient history, and epidemiological information. The expected result is Negative.  Fact Sheet for  Patients:  BloggerCourse.com  Fact Sheet for Healthcare Providers:  SeriousBroker.it  This test is no t yet approved or cleared by the [place] FDA and  has been authorized for detection and/or diagnosis of SARS-CoV-2 by FDA under an Emergency Use Authorization (EUA). This EUA will remain  in effect (meaning this test can be used) for the duration of the COVID-19 declaration under Section 564(b)(1) of the Act, 21 U.S.C.section 360bbb-3(b)(1), unless the authorization is terminated  or revoked sooner.       Influenza A by PCR NEGATIVE NEGATIVE Final   Influenza B by PCR NEGATIVE NEGATIVE Final    Comment: (NOTE) The Xpert Xpress SARS-CoV-2/FLU/RSV plus assay is intended as an aid in the diagnosis of influenza from Nasopharyngeal swab specimens and should not be used as a sole basis for treatment. Nasal washings and aspirates are unacceptable for Xpert Xpress SARS-CoV-2/FLU/RSV testing.  Fact Sheet for Patients: BloggerCourse.com  Fact Sheet for Healthcare Providers: SeriousBroker.it  This test is not yet approved or cleared by the [place] FDA and has been authorized for detection and/or diagnosis of SARS-CoV-2 by FDA under an Emergency Use Authorization (EUA). This EUA will remain in effect (meaning this test can be used) for the duration of the COVID-19 declaration under Section 564(b)(1) of the Act, 21 U.S.C. section 360bbb-3(b)(1), unless the authorization is terminated or revoked.     Resp Syncytial Virus by PCR NEGATIVE NEGATIVE Final    Comment: (NOTE) Fact Sheet for Patients: BloggerCourse.com  Fact Sheet for Healthcare Providers: SeriousBroker.it  This test is not yet approved or cleared by the [place] FDA and has been authorized for detection and/or diagnosis of SARS-CoV-2 by FDA under an Emergency  Use Authorization (EUA). This EUA will remain in effect (meaning this test can be used) for the duration of the COVID-19 declaration under Section 564(b)(1) of the Act, 21 U.S.C. section 360bbb-3(b)(1), unless the authorization is terminated or revoked.  Performed at [facility], [address] [place], [place] [address]   Respiratory (~20 pathogens) panel by PCR     Status: Abnormal   Collection Time: 07/29/23  1:34 PM   Specimen: Nasopharyngeal Swab; Respiratory  Result Value Ref Range Status   Adenovirus NOT DETECTED NOT DETECTED Final   Coronavirus 229E NOT DETECTED NOT DETECTED Final  Comment: (NOTE) The Coronavirus on the Respiratory Panel, DOES NOT test for the novel  Coronavirus (2019 nCoV)    Coronavirus HKU1 NOT DETECTED NOT DETECTED Final   Coronavirus NL63 NOT DETECTED NOT DETECTED Final   Coronavirus OC43 NOT DETECTED NOT DETECTED Final   Metapneumovirus NOT DETECTED NOT DETECTED Final   Rhinovirus / Enterovirus DETECTED (A) NOT DETECTED Final   Influenza A NOT DETECTED NOT DETECTED Final   Influenza B NOT DETECTED NOT DETECTED Final   Parainfluenza Virus 1 NOT DETECTED NOT DETECTED Final   Parainfluenza Virus 2 NOT DETECTED NOT DETECTED Final   Parainfluenza Virus 3 NOT DETECTED NOT DETECTED Final   Parainfluenza Virus 4 NOT DETECTED NOT DETECTED Final   Respiratory Syncytial Virus NOT DETECTED NOT DETECTED Final   Bordetella pertussis NOT DETECTED NOT DETECTED Final   Bordetella Parapertussis NOT DETECTED NOT DETECTED Final   Chlamydophila pneumoniae NOT DETECTED NOT DETECTED Final   Mycoplasma pneumoniae NOT DETECTED NOT DETECTED Final    Comment: Performed at [facility], [address] [place], [place] [address]     Total time spend on discharging this patient, including the last patient exam, discussing the hospital stay, instructions for ongoing care as it relates to all pertinent caregivers, as well as preparing the medical discharge  records, prescriptions, and/or referrals as applicable, is 45 minutes.    Darlin Priestly, MD  [facility] 07/30/2023, 12:24 PM

## 2023-07-30 NOTE — Evaluation (Signed)
Physical Therapy Evaluation Patient Details Name: [name] [name] MRN: [id number] DOB: December 13, 1920 Today's Date: 07/30/2023  History of Present Illness  Pt is a 89 y.o. male WW[employer] veteran presenting to hospital with generalized weakness.  Pt admitted with generalized weakness, cough, leukocytosis.  PMH includes htn, HLD, CAD, RBBB, bradycardia, T2DM, L TKA, and CVA.  Clinical Impression  Prior to recent medical concerns, pt was independent with ambulation and working; lives alone; has family support.  No c/o pain during session.  Currently pt is modified independent with bed mobility; modified independent with transfers; and CGA to ambulate 120 feet (no AD use).  No loss of balance noted during sessions activities.  Pt reports feeling a lot better and that his walking is back to baseline; pt declining further PT upon hospital discharge.  Pt would currently benefit from skilled PT to address noted impairments and functional limitations during hospitalization (see below for any additional details).    If plan is discharge home, recommend the following: Assist for transportation   Can travel by private vehicle    Yes    Equipment Recommendations None recommended by PT  Recommendations for Other Services       Functional Status Assessment Patient has had a recent decline in their functional status and demonstrates the ability to make significant improvements in function in a reasonable and predictable amount of time.     Precautions / Restrictions Precautions Precautions: Fall Restrictions Weight Bearing Restrictions: No      Mobility  Bed Mobility Overal bed mobility: Modified Independent             General bed mobility comments: Mild increased effort to perform on own but no assist required (supine to/from sitting)    Transfers Overall transfer level: Modified independent Equipment used: None               General transfer comment: Able to stand from low bed  without assistance (mild increased effort to stand but no loss of balance noted)    Ambulation/Gait Ambulation/Gait assistance: Contact guard assist Gait Distance (Feet): 120 Feet Assistive device: None Gait Pattern/deviations: Step-through pattern Gait velocity: mildly decreased     General Gait Details: mild increased B lateral sway but steady  Stairs            Wheelchair Mobility     Tilt Bed    Modified Rankin (Stroke Patients Only)       Balance Overall balance assessment: Needs assistance Sitting-balance support: No upper extremity supported, Feet supported Sitting balance-Leahy Scale: Normal Sitting balance - Comments: steady reaching outside BOS   Standing balance support: No upper extremity supported, During functional activity Standing balance-Leahy Scale: Good Standing balance comment: no loss of balance noted during sessions activities (including ambulation, ambulation with head turns R/L, turns)                             Pertinent Vitals/Pain Pain Assessment Pain Assessment: No/denies pain Vitals (HR and SpO2 on room air) stable and WFL throughout treatment session.    Home Living Family/patient expects to be discharged to:: Private residence Living Arrangements: Alone Available Help at Discharge: Family;Available 24 hours/day Type of Home: House Home Access: Stairs to enter Entrance Stairs-Rails:  (one-side rail) Entrance Stairs-Number of Steps: 3   Home Layout: One level Home Equipment: Agricultural consultant (2 wheels);Cane - single point;Wheelchair - manual;Other (comment) Additional Comments: Pt's daughter from [place] coming to stay with him at  d/c; granddaughter and other daughter live nearby.    Prior Function Prior Level of Function : Independent/Modified Independent;Working/employed;Driving;History of Falls (last six months)             Mobility Comments: Pt typically ambulatory with AD use.  Last fall about 2 months ago  (carrying some chairs out to a shed). ADLs Comments: Per OT eval "(I) ADL/IADL; drives; (I) medication management. 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 Is a [employer]; this summer he went to [place] for the 80th anniversary of D-day and was able to walk unassisted onto [employer]     Extremity/Trunk Assessment   Upper Extremity Assessment Upper Extremity Assessment: Overall [facility] for tasks assessed    Lower Extremity Assessment Lower Extremity Assessment: Generalized weakness    Cervical / Trunk Assessment Cervical / Trunk Assessment: Normal  Communication   Communication Communication: Hearing impairment Cueing Techniques: Verbal cues  Cognition Arousal: Alert Behavior During Therapy: WFL for tasks assessed/performed Overall Cognitive Status: Within Functional Limits for tasks assessed                                 General Comments: Appropriate and conversational; no difficulty following cues        General Comments Nursing cleared pt for participation in physical therapy.  Pt agreeable to PT session.    Exercises     Assessment/Plan    PT Assessment Patient needs continued PT services  PT Problem List Decreased strength;Decreased activity tolerance;Decreased mobility       PT Treatment Interventions DME instruction;Gait training;Stair training;Functional mobility training;Therapeutic activities;Therapeutic exercise;Balance training;Patient/family education    PT Goals (Current goals can be found in the Care Plan section)  Acute Rehab PT Goals Patient Stated Goal: to improve overall strength PT Goal Formulation: With patient Time For Goal Achievement: 08/13/23 Potential to Achieve Goals: Good    Frequency Min 1X/week     Co-evaluation               AM-PAC PT "6 Clicks" Mobility  Outcome Measure Help needed turning from your back to your side while in a flat bed without  using bedrails?: None Help needed moving from lying on your back to sitting on the side of a flat bed without using bedrails?: None Help needed moving to and from a bed to a chair (including a wheelchair)?: None Help needed standing up from a chair using your arms (e.g., wheelchair or bedside chair)?: None Help needed to walk in hospital room?: A Little Help needed climbing 3-5 steps with a railing? : A Little 6 Click Score: 22    End of Session Equipment Utilized During Treatment: Gait belt Activity Tolerance: Patient tolerated treatment well Patient left: in bed;with call bell/phone within reach;with bed alarm set Nurse Communication: Mobility status;Precautions PT Visit Diagnosis: Muscle weakness (generalized) (M62.81);Other abnormalities of gait and mobility (R26.89)    Time: 1324-4010 PT Time Calculation (min) (ACUTE ONLY): 16 min   Charges:   PT Evaluation $PT Eval Low Complexity: 1 Low   PT General Charges $$ ACUTE PT VISIT: 1 Visit        Hendricks Limes, PT 07/30/23, 12:48 PM

## 2023-07-30 NOTE — Evaluation (Signed)
Occupational Therapy Evaluation Patient Details Name: [name] [name] MRN: [id number] DOB: [birth year] Today's Date: 07/30/2023   History of Present Illness [name] [name] is a 89 y.o. male [employer] veteran with medical history significant of HTN, HLD CAD, GERD, RBBB, T2DM and CVA who presented to the ED for evaluation of generalized weakness. Patient states that over the last week, he has had a congested cough productive with yellow sputum. Over the last few days, he has become very weak and had trouble getting out of bed this morning.   Clinical Impression   Patient received for OT evaluation. See flowsheet below for details of function. Generally, patient MOD (I) for bed mobility, MOD (I) for functional mobility, and set up for ADLs. Pt demonstrating slightly below baseline activity tolerance and decreased speed of tasks.  Patient will benefit from continued OT while in acute care.       If plan is discharge home, recommend the following: Assistance with cooking/housework    Functional Status Assessment  Patient has had a recent decline in their functional status and demonstrates the ability to make significant improvements in function in a reasonable and predictable amount of time.  Equipment Recommendations  Tub/shower seat    Recommendations for Other Services       Precautions / Restrictions Precautions Precautions: Fall Restrictions Weight Bearing Restrictions: No      Mobility Bed Mobility Overal bed mobility: Modified Independent             General bed mobility comments: Extra time t/f supine to sit.    Transfers Overall transfer level: Modified independent Equipment used: None               General transfer comment: Able to stand from low bed without assistance. Pt able to walk in hallway x approximately 50 feet, short steps, but no loss of balance.      Balance Overall balance assessment: Mild deficits observed, not formally tested (Pt has hx of  falls, but only when doing activities like carrying heavy loads.)                                         ADL either performed or assessed with clinical judgement   ADL Overall ADL's : Modified independent                                       General ADL Comments: Pt requiring extra time for donning BIL socks at EOB with combination of leaning forward and using figure four position; denies dizziness. O2 saturation throughout this exertional activity 95% on room air. Pt able to use BIL UE functionally thoughout session. States that since there was no toilet in his room overnight he walked over to the sink to urinate independently. Able to don gown (at pt request; pt wearing shorts and t-shirt during session) at end of session with set up. Mild deficits in activity tolerance compared to very active baseline.     Vision Patient Visual Report: No change from baseline (Pt walking and doing daily activities without glasses.)       Perception         Praxis         Pertinent Vitals/Pain Pain Assessment Pain Assessment: No/denies pain     Extremity/Trunk Assessment Upper Extremity Assessment  Upper Extremity Assessment: Overall WFL for tasks assessed   Lower Extremity Assessment Lower Extremity Assessment: Overall WFL for tasks assessed;Defer to PT evaluation   Cervical / Trunk Assessment Cervical / Trunk Assessment: Normal   Communication Communication Communication: Hearing impairment   Cognition Arousal: Alert Behavior During Therapy: WFL for tasks assessed/performed Overall Cognitive Status: Within Functional Limits for tasks assessed                                 General Comments: A+O; good sense of humor; follows all commands.     General Comments  On room air throughout session.    Exercises     Shoulder Instructions      Home Living Family/patient expects to be discharged to:: Private residence Living  Arrangements: Alone Available Help at Discharge: Family;Available 24 hours/day Type of Home: House Home Access: Stairs to enter [employer] of Steps: 3 Entrance Stairs-Rails:  (one-side rail) Home Layout: One level     Bathroom Shower/Tub: Producer, television/film/video: Standard     Home Equipment: Agricultural consultant (2 wheels);Cane - single point;Wheelchair - Careers adviser (comment) ("thunder mug"- basically a urinal)   Additional Comments: Pt's daughter from [place] coming to stay with him at d/c; granddaughter and other daughter live nearby.      Prior Functioning/Environment Prior Level of Function : Independent/Modified Independent;Working/employed;Driving;History of Falls (last six months)             Mobility Comments: Pt typically mobilizes without AD. States his last fall was about 2 months ago when he was carrying some chairs out to a shed; states that he does not fall unless carrying something or "doing something I shouldn't be doing". ADLs Comments: (I) ADL/IADL; drives; (I) medication management. 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        OT Problem List: Decreased activity tolerance      OT Treatment/Interventions: Therapeutic exercise;Self-care/ADL training;Therapeutic activities    OT Goals(Current goals can be found in the care plan section) Acute Rehab OT Goals Patient Stated Goal: Get back to prior level of function OT Goal Formulation: With patient Time For Goal Achievement: 08/13/23 Potential to Achieve Goals: Good ADL Goals Pt Will Perform Grooming: Independently;standing Pt Will Transfer to Toilet: Independently;regular height toilet Pt Will Perform Toileting - Clothing Manipulation and hygiene: Independently;sit to/from stand  OT Frequency:  Min 1X/week    Co-evaluation              AM-PAC OT "6 Clicks" Daily Activity     Outcome Measure Help from another person eating meals?: None Help from another person taking care of personal grooming?: None Help from another person toileting, which includes using toliet, bedpan, or urinal?: None Help from another person bathing (including washing, rinsing, drying)?: A Little Help from another person to put on and taking off regular upper body clothing?: None Help from another person to put on and taking off regular lower body clothing?: None 6 Click Score: 23   End of Session Nurse Communication: Mobility status  Activity Tolerance: Patient tolerated treatment well Patient left: Other (comment) (seated at the edge of the bed, set up for eating breakfast.)  OT Visit Diagnosis: Other (comment) (decreased activity tolerance)  Time: 1610-9604 OT Time Calculation (min): 23 min Charges:  OT General Charges $OT Visit: 1 Visit OT Evaluation $OT Eval Moderate Complexity: 1 Mod  Aadi Bordner Junie Panning, MS, OTR/L  Alvester Morin 07/30/2023, 9:04 AM

## 2023-08-03 LAB — CULTURE, BLOOD (ROUTINE X 2)
Culture: NO GROWTH
Culture: NO GROWTH
Special Requests: ADEQUATE

## 2023-08-04 ENCOUNTER — Ambulatory Visit: Payer: Medicare Other | Admitting: Cardiovascular Disease

## 2023-08-22 ENCOUNTER — Other Ambulatory Visit: Payer: Self-pay | Admitting: Cardiovascular Disease

## 2023-12-07 ENCOUNTER — Other Ambulatory Visit: Payer: Self-pay | Admitting: General Practice

## 2023-12-07 ENCOUNTER — Other Ambulatory Visit: Payer: Self-pay | Admitting: Cardiovascular Disease

## 2023-12-20 ENCOUNTER — Emergency Department
Admission: EM | Admit: 2023-12-20 | Discharge: 2023-12-20 | Disposition: A | Discharge status: Home / Self Care | Attending: Emergency Medicine | Admitting: Emergency Medicine

## 2023-12-20 ENCOUNTER — Other Ambulatory Visit: Payer: Self-pay

## 2023-12-20 ENCOUNTER — Emergency Department

## 2023-12-20 DIAGNOSIS — W01198A Fall on same level from slipping, tripping and stumbling with subsequent striking against other object, initial encounter: Secondary | ICD-10-CM | POA: Insufficient documentation

## 2023-12-20 DIAGNOSIS — I2510 Atherosclerotic heart disease of native coronary artery without angina pectoris: Secondary | ICD-10-CM | POA: Insufficient documentation

## 2023-12-20 DIAGNOSIS — S01112A Laceration without foreign body of left eyelid and periocular area, initial encounter: Secondary | ICD-10-CM | POA: Diagnosis not present

## 2023-12-20 DIAGNOSIS — I10 Essential (primary) hypertension: Secondary | ICD-10-CM | POA: Insufficient documentation

## 2023-12-20 DIAGNOSIS — E119 Type 2 diabetes mellitus without complications: Secondary | ICD-10-CM | POA: Insufficient documentation

## 2023-12-20 DIAGNOSIS — S022XXA Fracture of nasal bones, initial encounter for closed fracture: Secondary | ICD-10-CM | POA: Diagnosis not present

## 2023-12-20 DIAGNOSIS — S0993XA Unspecified injury of face, initial encounter: Secondary | ICD-10-CM | POA: Diagnosis present

## 2023-12-20 DIAGNOSIS — Y92481 Parking lot as the place of occurrence of the external cause: Secondary | ICD-10-CM | POA: Insufficient documentation

## 2023-12-20 DIAGNOSIS — W19XXXA Unspecified fall, initial encounter: Secondary | ICD-10-CM

## 2023-12-20 MED ORDER — CEPHALEXIN 500 MG PO CAPS: 500.0000 mg | ORAL_CAPSULE | Freq: Four times a day (QID) | ORAL | 0 refills | Status: AC

## 2023-12-20 MED ORDER — LIDOCAINE-EPINEPHRINE 2 %-1:100000 IJ SOLN
20.0000 mL | Freq: Once | INTRAMUSCULAR | Status: AC
  Administered 2023-12-20: 20 mL
  Filled 2023-12-20: qty 1

## 2023-12-20 NOTE — ED Triage Notes (Addendum)
 Pt arrives via [facility] from [facility] parking lot where he tripped and fell. Pt denies LOC and dizziness. Pt ANO X3 on arrival and does not remember what happened. Not on any blood thinners but does take 81 mg of aspirin  daily. Hx of a heart attack 25 years ago. Pt has dried blood in his nose and on his L eyebrow on arrival  EMS vitals: 158 CBG 67 pulse 159/92 BP

## 2023-12-20 NOTE — ED Notes (Signed)
 Patient transported to CT

## 2023-12-20 NOTE — ED Notes (Signed)
 Jessup MD at bedside for lac repair

## 2023-12-20 NOTE — ED Notes (Signed)
 Pt presented to ED with c/o fall after tripping. Abrasion noted to chin and nose. Laceration noted to left eyebrow. Bleeding controlled. Wounds cleaned with normal saline. Pt tolerated well. Family at bedside, bed in lowest position, call bell within reach.

## 2023-12-20 NOTE — ED Provider Notes (Signed)
 [facility] Provider Note    Event Date/Time   First MD Initiated Contact with Patient 12/20/23 1728     (approximate)   History   Chief Complaint Fall   HPI  [name] [name] is a 89 y.o. male with a past medical history of hypertension, hyperlipidemia, diabetes, and CAD who presents to the ED following fall.  Per EMS, patient was in the parking lot of a local pharmacy when he fell forward, striking his face.  Patient is not sure what caused him to fall, but he denies losing consciousness he does not take a blood thinner.  He has a laceration above his left eyebrow as well as at the bridge of his nose, but denies significant headache, facial pain, or neck pain.  He currently states that he is feeling well and denies any complaints.     Physical Exam   Triage Vital Signs: ED Triage Vitals  Encounter Vitals Group     BP      Systolic BP Percentile      Diastolic BP Percentile      Pulse      Resp      Temp      Temp src      SpO2      Weight      Height      Head Circumference      Peak Flow      Pain Score      Pain Loc      Pain Education      Exclude from Growth Chart     Most recent vital signs: Vitals:   12/20/23 1930 12/20/23 2000  BP: (!) 151/83 (!) 144/93  Pulse: 64 65  Resp:    Temp:    SpO2: 96% 97%    Constitutional: Alert and oriented. Eyes: Conjunctivae are normal. Head: Edema to the bridge of the nose with small overlying laceration.  Small laceration above left eyebrow.  No scalp hematomas or step-offs noted. Nose: No congestion/rhinnorhea. Mouth/Throat: Mucous membranes are moist.  Neck: No midline cervical spine tenderness to palpation. Cardiovascular: Normal rate, regular rhythm. Grossly normal heart sounds.  2+ radial pulses bilaterally. Respiratory: Normal respiratory effort.  No retractions. Lungs CTAB. Gastrointestinal: Soft and nontender. No distention. Musculoskeletal: No lower extremity tenderness nor  edema.  Neurologic:  Normal speech and language. No gross focal neurologic deficits are appreciated.    ED Results / Procedures / Treatments   Labs (all labs ordered are listed, but only abnormal results are displayed) Labs Reviewed - No data to display   RADIOLOGY CT head reviewed and interpreted by me with no hemorrhage or midline shift.  PROCEDURES:  Critical Care performed: No  .Laceration Repair  Date/Time: 12/20/2023 8:43 PM  Performed by: Twilla Galea, MD Authorized by: Twilla Galea, MD   Consent:    Consent obtained:  Verbal   Consent given by:  Patient and healthcare agent Universal protocol:    Patient identity confirmed:  Verbally with patient and arm band Anesthesia:    Anesthesia method:  Local infiltration   Local anesthetic:  Lidocaine  2% WITH epi Laceration details:    Location:  Scalp   Scalp location:  Frontal   Length (cm):  4 Pre-procedure details:    Preparation:  Patient was prepped and draped in usual sterile fashion and imaging obtained to evaluate for foreign bodies Exploration:    Limited defect created (wound extended): no     Hemostasis achieved with:  Direct pressure and epinephrine    Imaging obtained comment:  CT   Imaging outcome: foreign body not noted     Wound exploration: wound explored through full range of motion and entire depth of wound visualized     Wound extent: areolar tissue not violated, fascia not violated, no foreign body, no signs of injury, no nerve damage, no tendon damage, no underlying fracture and no vascular damage     Contaminated: no   Treatment:    Area cleansed with:  Saline   Amount of cleaning:  Standard   Irrigation solution:  Sterile saline   Irrigation method:  Pressure wash   Debridement:  None   Undermining:  None   Scar revision: no   Skin repair:    Repair method:  Sutures   Suture size:  4-0   Suture material:  Nylon   Suture technique:  Simple interrupted   Number of sutures:   3 Approximation:    Approximation:  Close Repair type:    Repair type:  Simple Post-procedure details:    Dressing:  Open (no dressing)   Procedure completion:  Tolerated well, no immediate complications    MEDICATIONS ORDERED IN ED: Medications  lidocaine -EPINEPHrine  (XYLOCAINE  W/EPI) 2 %-1:100000 (with pres) injection 20 mL (20 mLs Infiltration Given by Other 12/20/23 2025)     IMPRESSION / MDM / ASSESSMENT AND PLAN / ED COURSE  I reviewed the triage vital signs and the nursing notes.                              89 y.o. male with past medical history of hypertension, hyperlipidemia, diabetes, and CAD who presents to the ED following fall in the parking lot, striking his head.  Patient's presentation is most consistent with acute presentation with potential threat to life or bodily function.  Differential diagnosis includes, but is not limited to, intracranial injury, cervical spine injury, facial fracture, laceration.  Patient nontoxic-appearing and in no acute distress, vital signs are unremarkable.  We will check CT head, cervical spine, and maxillofacial.  He has small lacerations to the bridge of his nose and above his left eyebrow.  Family confirms that he tripped and fell, no concerns for syncope.  CT imaging is remarkable for nasal bone fracture but otherwise negative for acute process.  Laceration above his left eyebrow was repaired along with small laceration to the bridge of his nose.  We will prescribe course of Keflex given nasal fracture with overlying laceration, patient counseled to follow-up with PCP for suture removal in 1 week.  Patient and family agree with plan.      FINAL CLINICAL IMPRESSION(S) / ED DIAGNOSES   Final diagnoses:  Fall, initial encounter  Closed fracture of nasal bone, initial encounter  Laceration of left eyebrow, initial encounter     Rx / DC Orders   ED Discharge Orders          Ordered    cephALEXin (KEFLEX) 500 MG capsule  4  times daily        12/20/23 2042             Note:  This document was prepared using Dragon voice recognition software and may include unintentional dictation errors.   Twilla Galea, MD 12/20/23 2045

## 2024-01-11 ENCOUNTER — Other Ambulatory Visit: Payer: Self-pay | Admitting: Cardiovascular Disease

## 2024-02-08 ENCOUNTER — Other Ambulatory Visit: Payer: Self-pay | Admitting: Cardiovascular Disease

## 2024-04-06 ENCOUNTER — Other Ambulatory Visit: Payer: Self-pay

## 2024-04-06 MED ORDER — RAMIPRIL 10 MG PO CAPS: 10.0000 mg | ORAL_CAPSULE | Freq: Every day | ORAL | 0 refills | Status: DC

## 2024-04-06 MED ORDER — METOPROLOL SUCCINATE ER 25 MG PO TB24: 12.5000 mg | ORAL_TABLET | Freq: Every day | ORAL | 0 refills | Status: DC

## 2024-04-06 MED ORDER — ROSUVASTATIN CALCIUM 40 MG PO TABS: 40.0000 mg | ORAL_TABLET | Freq: Every day | ORAL | 0 refills | Status: DC

## 2024-04-06 MED ORDER — ISOSORBIDE MONONITRATE ER 30 MG PO TB24: 30.0000 mg | ORAL_TABLET | Freq: Every day | ORAL | 0 refills | Status: DC

## 2024-04-25 DEATH — deceased
